# Patient Record
Sex: Female | Born: 1980 | Race: Black or African American | Hispanic: No | Marital: Married | Smoking: Never smoker
Health system: Southern US, Community
[De-identification: ages and names within clinical notes are randomized; demographics above are authoritative.]

## PROBLEM LIST (undated history)

## (undated) DIAGNOSIS — G4730 Sleep apnea, unspecified: Secondary | ICD-10-CM

## (undated) DIAGNOSIS — J45909 Unspecified asthma, uncomplicated: Secondary | ICD-10-CM

## (undated) DIAGNOSIS — I499 Cardiac arrhythmia, unspecified: Secondary | ICD-10-CM

## (undated) DIAGNOSIS — K567 Ileus, unspecified: Secondary | ICD-10-CM

## (undated) DIAGNOSIS — Z91199 Patient's noncompliance with other medical treatment and regimen due to unspecified reason: Secondary | ICD-10-CM

## (undated) DIAGNOSIS — Z800 Family history of malignant neoplasm of digestive organs: Secondary | ICD-10-CM

## (undated) DIAGNOSIS — R0789 Other chest pain: Secondary | ICD-10-CM

## (undated) DIAGNOSIS — N83209 Unspecified ovarian cyst, unspecified side: Secondary | ICD-10-CM

## (undated) DIAGNOSIS — I509 Heart failure, unspecified: Secondary | ICD-10-CM

## (undated) DIAGNOSIS — F419 Anxiety disorder, unspecified: Secondary | ICD-10-CM

## (undated) DIAGNOSIS — D219 Benign neoplasm of connective and other soft tissue, unspecified: Secondary | ICD-10-CM

## (undated) DIAGNOSIS — G4733 Obstructive sleep apnea (adult) (pediatric): Secondary | ICD-10-CM

## (undated) DIAGNOSIS — R011 Cardiac murmur, unspecified: Secondary | ICD-10-CM

## (undated) DIAGNOSIS — K219 Gastro-esophageal reflux disease without esophagitis: Secondary | ICD-10-CM

## (undated) DIAGNOSIS — I493 Ventricular premature depolarization: Secondary | ICD-10-CM

## (undated) DIAGNOSIS — Z8489 Family history of other specified conditions: Secondary | ICD-10-CM

## (undated) DIAGNOSIS — I10 Essential (primary) hypertension: Secondary | ICD-10-CM

## (undated) DIAGNOSIS — R87810 Cervical high risk human papillomavirus (HPV) DNA test positive: Secondary | ICD-10-CM

## (undated) DIAGNOSIS — A749 Chlamydial infection, unspecified: Secondary | ICD-10-CM

## (undated) HISTORY — DX: Chlamydial infection, unspecified: A74.9

## (undated) HISTORY — PX: INFERIOR OBLIQUE MYECTOMY: SHX1814

## (undated) HISTORY — DX: Family history of malignant neoplasm of digestive organs: Z80.0

## (undated) HISTORY — PX: OVARIAN CYST REMOVAL: SHX89

## (undated) HISTORY — PX: APPENDECTOMY: SHX54

## (undated) HISTORY — DX: Unspecified ovarian cyst, unspecified side: N83.209

## (undated) HISTORY — PX: DILATION AND CURETTAGE OF UTERUS: SHX78

## (undated) HISTORY — DX: Sleep apnea, unspecified: G47.30

## (undated) HISTORY — DX: Benign neoplasm of connective and other soft tissue, unspecified: D21.9

## (undated) HISTORY — PX: OTHER SURGICAL HISTORY: SHX169

## (undated) HISTORY — DX: Patient's noncompliance with other medical treatment and regimen due to unspecified reason: Z91.199

---

## 2019-09-21 ENCOUNTER — Ambulatory Visit
Admission: EM | Admit: 2019-09-21 | Discharge: 2019-09-21 | Disposition: A | Discharge status: Home / Self Care | Payer: BC Managed Care – PPO | Attending: Emergency Medicine | Admitting: Emergency Medicine

## 2019-09-21 ENCOUNTER — Encounter: Payer: Self-pay | Admitting: Emergency Medicine

## 2019-09-21 DIAGNOSIS — M791 Myalgia, unspecified site: Secondary | ICD-10-CM

## 2019-09-21 DIAGNOSIS — B349 Viral infection, unspecified: Secondary | ICD-10-CM

## 2019-09-21 DIAGNOSIS — R5383 Other fatigue: Secondary | ICD-10-CM | POA: Diagnosis not present

## 2019-09-21 NOTE — ED Provider Notes (Signed)
Roderic Palau    CSN: [id number] Arrival date & time: 09/21/19  1244      History   Chief Complaint Chief Complaint  Patient presents with  . Generalized Body Aches    HPI Alison Frazier is a 38 y.o. female.   Patient presents with body aches and fatigue x 2 days.  She also reports she had a headache briefly on the first day but this is resolved.  She denies fever, chills, ear pain, sore throat, cough, shortness of breath, vomiting, diarrhea, rash, or other symptoms.  No treatments attempted at home.    The history is provided by the patient.    History reviewed. No pertinent past medical history.  There are no problems to display for this patient.   Past Surgical History:  Procedure Laterality Date  . APPENDECTOMY    . dnc    . INFERIOR OBLIQUE MYECTOMY    . OVARIAN CYST REMOVAL      OB History   No obstetric history on file.      Home Medications    Prior to Admission medications   Not on File    Family History Family History  Problem Relation Age of Onset  . Stroke Mother   . Cancer Father   . Diabetes Father   . Hypertension Father     Social History Social History   Tobacco Use  . Smoking status: Never Smoker  . Smokeless tobacco: Never Used  Substance Use Topics  . Alcohol use: Not Currently  . Drug use: Not Currently     Allergies   Contrast media  [iodinated diagnostic agents] and Shellfish-derived products   Review of Systems Review of Systems  Constitutional: Positive for fatigue. Negative for chills and fever.  HENT: Negative for congestion, ear pain, rhinorrhea and sore throat.   Eyes: Negative for pain and visual disturbance.  Respiratory: Negative for cough and shortness of breath.   Cardiovascular: Negative for chest pain and palpitations.  Gastrointestinal: Negative for abdominal pain, diarrhea, nausea and vomiting.  Genitourinary: Negative for dysuria and hematuria.  Musculoskeletal: Negative for arthralgias and  back pain.  Skin: Negative for color change and rash.  Neurological: Negative for seizures and syncope.  All other systems reviewed and are negative.    Physical Exam Triage Vital Signs ED Triage Vitals  Enc Vitals Group     BP 09/21/19 1256 (!) 154/89     Pulse Rate 09/21/19 1256 82     Resp 09/21/19 1256 18     Temp 09/21/19 1256 97.7 F (36.5 C)     Temp src --      SpO2 09/21/19 1256 98 %     Weight --      Height --      Head Circumference --      Peak Flow --      Pain Score 09/21/19 1249 9     Pain Loc --      Pain Edu? --      Excl. in [place]? --    No data found.  Updated Vital Signs BP (!) 154/89   Pulse 82   Temp 97.7 F (36.5 C)   Resp 18   LMP 09/07/2019   SpO2 98%   Visual Acuity Right Eye Distance:   Left Eye Distance:   Bilateral Distance:    Right Eye Near:   Left Eye Near:    Bilateral Near:     Physical Exam Vitals and nursing note reviewed.  Constitutional:      General: She is not in acute distress.    Appearance: She is well-developed. She is not ill-appearing.  HENT:     Head: Normocephalic and atraumatic.     Right Ear: Tympanic membrane normal.     Left Ear: Tympanic membrane normal.     Nose: Nose normal.     Mouth/Throat:     Mouth: Mucous membranes are moist.     Pharynx: Oropharynx is clear.  Eyes:     Conjunctiva/sclera: Conjunctivae normal.  Cardiovascular:     Rate and Rhythm: Normal rate and regular rhythm.     Heart sounds: No murmur.  Pulmonary:     Effort: Pulmonary effort is normal. No respiratory distress.     Breath sounds: Normal breath sounds.  Abdominal:     General: Bowel sounds are normal.     Palpations: Abdomen is soft.     Tenderness: There is no abdominal tenderness. There is no guarding or rebound.  Musculoskeletal:     Cervical back: Neck supple.  Skin:    General: Skin is warm and dry.     Findings: No rash.  Neurological:     General: No focal deficit present.     Mental Status: She is alert  and oriented to person, place, and time.  Psychiatric:        Mood and Affect: Mood normal.        Behavior: Behavior normal.      UC Treatments / Results  Labs (all labs ordered are listed, but only abnormal results are displayed) Labs Reviewed  NOVEL CORONAVIRUS, NAA    EKG   Radiology No results found.  Procedures Procedures (including critical care time)  Medications Ordered in UC Medications - No data to display  Initial Impression / Assessment and Plan / UC Course  I have reviewed the triage vital signs and the nursing notes.  Pertinent labs & imaging results that were available during my care of the patient were reviewed by me and considered in my medical decision making (see chart for details).    Viral illness.  Instructed patient to take Tylenol as needed for fever or discomfort.  COVID test performed here.  Instructed patient to self quarantine until the test result is back.  Instructed patient to go to the emergency department if she develops high fever, shortness of breath, severe diarrhea, or other concerning symptoms.  Patient agrees with plan of care.     Final Clinical Impressions(s) / UC Diagnoses   Final diagnoses:  Viral illness     Discharge Instructions     Take Tylenol as needed for fever or discomfort.    Your COVID test is pending.  You should self quarantine until your test result is back and is negative.    Go to the emergency department if you develop high fever, shortness of breath, severe diarrhea, or other concerning symptoms.       ED Prescriptions    None     PDMP not reviewed this encounter.   Sharion Balloon, NP 09/21/19 [id number]

## 2019-09-21 NOTE — ED Triage Notes (Signed)
Pt c/o body aches and body soreness x2 days. Denies sick exposures. Denies any other symptoms. States she had a bad headache on Tuesday.

## 2019-09-21 NOTE — Discharge Instructions (Addendum)
Take Tylenol as needed for fever or discomfort.    Your COVID test is pending.  You should self quarantine until your test result is back and is negative.    Go to the emergency department if you develop high fever, shortness of breath, severe diarrhea, or other concerning symptoms.

## 2019-09-23 LAB — NOVEL CORONAVIRUS, NAA: SARS-CoV-2, NAA: NOT DETECTED

## 2019-11-07 NOTE — Patient Instructions (Addendum)
I value your feedback and entrusting us with your care. If you get a [facility] patient survey, I would appreciate you taking the time to let us know about your experience today. Thank you!  As of September 07, 2019, your lab results will be released to your MyChart immediately, before I even have a chance to see them. Please give me time to review them and contact you if there are any abnormalities. Thank you for your patience.   [facility]: [phone]  [facility]: [phone]  

## 2019-11-07 NOTE — Progress Notes (Signed)
PCP:  [facility], [facility]   Chief Complaint  Patient presents with  . Gynecologic Exam    lower abdomen/lower back discomfort/pressure x 2 weeks, no uti sx     HPI:      [name] is a 39 y.o. No obstetric history on file. who LMP was Patient's last menstrual period was 10/09/2019 (approximate)., presents today for her NP annual examination.  Her menses are regular every 28-30 days, lasting 7 days.  Dysmenorrhea none. She does not have intermenstrual bleeding. Did have 1 day of nausea and bleeding after her 12/20 menses, but that was unusual for pt.  Has been having pelvic pressure intermittently daily for the past 1-2 wks. No urin sx, vag, or GI sx. No fevers. Does have bad LBP mid lumbar area. No meds taken for sx. No aggrav/allev factors. Back hurts more when lying down however. Not affecting sleep. Hx of ovar cyst in past with cystectomy, as well as leio and myomectomy.   Sex activity: single partner, contraception - none.  Last Pap: not recent; no hx of abn with tx Hx of STDs: chlamydia in distant past  Last mammogram: never There is no FH of breast cancer. There is no FH of ovarian cancer. The patient does do self-breast exams.  Tobacco use: The patient denies current or previous tobacco use. Alcohol use: none No drug use.  Exercise: not active  She does get adequate calcium and Vitamin D in her diet. No recent labs. New to area, getting established with new PCP. Wants to wait for labs with them.   Past Medical History:  Diagnosis Date  . Chlamydia   . Family history of colon cancer in father   . Leiomyoma   . Ovarian cyst      Past Surgical History:  Procedure Laterality Date  . APPENDECTOMY    . DILATION AND CURETTAGE OF UTERUS    . dnc    . INFERIOR OBLIQUE MYECTOMY    . OVARIAN CYST REMOVAL      Family History  Problem Relation Age of Onset  . Stroke Mother   . Diabetes Father   . Hypertension Father   . Colon cancer Father    . Throat cancer Father   . Breast cancer Neg Hx   . Ovarian cancer Neg Hx     Social History   Socioeconomic History  . Marital status: Married    Spouse name: Not on file  . Number of children: Not on file  . Years of education: Not on file  . Highest education level: Not on file  Occupational History  . Not on file  Tobacco Use  . Smoking status: Never Smoker  . Smokeless tobacco: Never Used  Substance and Sexual Activity  . Alcohol use: Not Currently  . Drug use: Not Currently  . Sexual activity: Yes    Birth control/protection: None  Other Topics Concern  . Not on file  Social History Narrative  . Not on file   Social Determinants of Health   Financial Resource Strain:   . Difficulty of Paying Living Expenses: Not on file  Food Insecurity:   . Worried About Charity fundraiser in the Last Year: Not on file  . Ran Out of Food in the Last Year: Not on file  Transportation Needs:   . Lack of Transportation (Medical): Not on file  . Lack of Transportation (Non-Medical): Not on file  Physical Activity:   . Days of Exercise  per Week: Not on file  . Minutes of Exercise per Session: Not on file  Stress:   . Feeling of Stress : Not on file  Social Connections:   . Frequency of Communication with Friends and Family: Not on file  . Frequency of Social Gatherings with Friends and Family: Not on file  . Attends Religious Services: Not on file  . Active Member of Clubs or Organizations: Not on file  . Attends Archivist Meetings: Not on file  . Marital Status: Not on file  Intimate Partner Violence:   . Fear of Current or Ex-Partner: Not on file  . Emotionally Abused: Not on file  . Physically Abused: Not on file  . Sexually Abused: Not on file    No current outpatient medications on file.     ROS:  Review of Systems  Constitutional: Negative for fatigue, fever and unexpected weight change.  Respiratory: Negative for cough, shortness of breath and  wheezing.   Cardiovascular: Negative for chest pain, palpitations and leg swelling.  Gastrointestinal: Negative for blood in stool, constipation, diarrhea, nausea and vomiting.  Endocrine: Negative for cold intolerance, heat intolerance and polyuria.  Genitourinary: Positive for pelvic pain. Negative for dyspareunia, dysuria, flank pain, frequency, genital sores, hematuria, menstrual problem, urgency, vaginal bleeding, vaginal discharge and vaginal pain.  Musculoskeletal: Positive for back pain. Negative for joint swelling and myalgias.  Skin: Negative for rash.  Neurological: Negative for dizziness, syncope, light-headedness, numbness and headaches.  Hematological: Negative for adenopathy.  Psychiatric/Behavioral: Positive for agitation. Negative for confusion, sleep disturbance and suicidal ideas. The patient is not nervous/anxious.   BREAST: No symptoms   Objective: BP 140/90   Ht 5\' 4"  (1.626 m)   Wt 185 lb (83.9 kg)   LMP 10/09/2019 (Approximate)   BMI 31.76 kg/m    Physical Exam Constitutional:      Appearance: She is well-developed.  Genitourinary:     Vulva, vagina, cervix, uterus, right adnexa and left adnexa normal.     No vulval lesion or tenderness noted.     No vaginal discharge, erythema or tenderness.     No cervical polyp.     Uterus is not enlarged or tender.     No right or left adnexal mass present.     Right adnexa not tender.     Left adnexa not tender.  Neck:     Thyroid: No thyromegaly.  Cardiovascular:     Rate and Rhythm: Normal rate and regular rhythm.     Heart sounds: Normal heart sounds. No murmur.  Pulmonary:     Effort: Pulmonary effort is normal.     Breath sounds: Normal breath sounds.  Chest:     Breasts:        Right: No mass, nipple discharge, skin change or tenderness.        Left: No mass, nipple discharge, skin change or tenderness.  Abdominal:     Palpations: Abdomen is soft.     Tenderness: There is no abdominal tenderness.  There is no guarding.  Musculoskeletal:        General: Normal range of motion.     Cervical back: Normal range of motion.     Lumbar back: No signs of trauma, tenderness or bony tenderness.       Back:  Neurological:     General: No focal deficit present.     Mental Status: She is alert and oriented to person, place, and time.     Cranial  Nerves: No cranial nerve deficit.  Skin:    General: Skin is warm and dry.  Psychiatric:        Mood and Affect: Mood normal.        Behavior: Behavior normal.        Thought Content: Thought content normal.        Judgment: Judgment normal.  Vitals reviewed.     Results:  ULTRASOUND REPORT  Location: [facility]  Date of Service: 11/08/2019    Indications:Pelvic Pain Findings:  The uterus is retroverted and measures 6.9 x 4.2 x 4.0 cm. Echo texture is homogenous with evidence of focal masses. Within the uterus is one suspected fibroid measuring: Fibroid 1:20.4 x 16.6 x 13.9 mm less than 50% submucosal, posterior fundal  The Endometrium measures 7.6 mm.  Right Ovary measures 4.6 x 4.5 x 3.3 cm. There is a small, complex cyst in the right ovary. The cyst measures 25.3 x 25.0 x 25.9 mm. There is blood from around the periphery of the cyst only. This may represent a corpus luteal cyst vs. Other.  Left Ovary measures 3.1 x 3.3 x 2.0 cm. It is normal in appearance. Survey of the adnexa shows a right hydrosalpinx with anechoic fluid. The right fallopian tube measures up to 21.5 mm. Normal appearing left adnexa. There is a small amount of anechoic free fluid in the cul de sac.  Impression: 1. There is one 2.0 cm fibroid that may have a small portion that is submucosal.  2. Normal appearing endometrium, right ovary and cervix.  3. There is a 2.6 cm complex cyst in the right ovary.  4. There is a right hydrosalpinx.    Recommendations: 1.Clinical correlation with the patient's History and Physical Exam.   Gweneth Dimitri, RT  Assessment/Plan: Encounter for annual routine gynecological examination  Cervical cancer screening - Plan: Cytology -   Screening for STD (sexually transmitted disease) - Plan: Cytology - PAP  Screening for HPV (human papillomavirus) - Plan: Cytology - PAP,   Encounter for screening mammogram for malignant neoplasm of breast - Plan: MM 3D SCREEN BREAST BILATERAL; pt to sched mammo  Pelvic pressure in female - Plan: US PELVIS TRANSVAGINAL NON-OB (TV ONLY); small leio, RT complex cyst and RT hydrosalpinx on u/s. Rule out STD. If neg, will check ca-125 although most likely normal. If all neg, just repeat GYN u/s in 8 wks. Pt agrees.  Leiomyoma - Plan: US PELVIS TRANSVAGINAL NON-OB (TV ONLY); very small; most likely no change in size compared to pt report of previous leio size  Hydrosalpinx--rule out STD with labs. If neg, check ca-125. If neg, recheck u/s in 8 months.   Low back pain--stretch/core exercises/NSAIDs and heating pad. Most likely MSK.      GYN counsel breast self exam, mammography screening, adequate intake of calcium and vitamin D, diet and exercise     F/U  Return in about 1 year (around 11/07/2020).   B. , PA-C 11/08/2019 4:10 PM

## 2019-11-08 ENCOUNTER — Other Ambulatory Visit: Payer: Self-pay

## 2019-11-08 ENCOUNTER — Encounter: Payer: Self-pay | Admitting: Obstetrics and Gynecology

## 2019-11-08 ENCOUNTER — Other Ambulatory Visit (HOSPITAL_COMMUNITY)
Admission: RE | Admit: 2019-11-08 | Discharge: 2019-11-08 | Disposition: A | Discharge status: Home / Self Care | Payer: BC Managed Care – PPO | Source: Ambulatory Visit | Attending: Obstetrics and Gynecology | Admitting: Obstetrics and Gynecology

## 2019-11-08 ENCOUNTER — Ambulatory Visit (INDEPENDENT_AMBULATORY_CARE_PROVIDER_SITE_OTHER): Payer: BC Managed Care – PPO | Admitting: Obstetrics and Gynecology

## 2019-11-08 ENCOUNTER — Ambulatory Visit (INDEPENDENT_AMBULATORY_CARE_PROVIDER_SITE_OTHER): Payer: BC Managed Care – PPO

## 2019-11-08 VITALS — BP 140/90 | Ht 64.0 in | Wt 185.0 lb

## 2019-11-08 DIAGNOSIS — N83201 Unspecified ovarian cyst, right side: Secondary | ICD-10-CM | POA: Diagnosis not present

## 2019-11-08 DIAGNOSIS — Z1231 Encounter for screening mammogram for malignant neoplasm of breast: Secondary | ICD-10-CM

## 2019-11-08 DIAGNOSIS — Z01411 Encounter for gynecological examination (general) (routine) with abnormal findings: Secondary | ICD-10-CM | POA: Diagnosis not present

## 2019-11-08 DIAGNOSIS — D219 Benign neoplasm of connective and other soft tissue, unspecified: Secondary | ICD-10-CM | POA: Diagnosis not present

## 2019-11-08 DIAGNOSIS — R102 Pelvic and perineal pain: Secondary | ICD-10-CM | POA: Diagnosis not present

## 2019-11-08 DIAGNOSIS — Z1151 Encounter for screening for human papillomavirus (HPV): Secondary | ICD-10-CM | POA: Diagnosis present

## 2019-11-08 DIAGNOSIS — N7011 Chronic salpingitis: Secondary | ICD-10-CM

## 2019-11-08 DIAGNOSIS — Z124 Encounter for screening for malignant neoplasm of cervix: Secondary | ICD-10-CM

## 2019-11-08 DIAGNOSIS — D250 Submucous leiomyoma of uterus: Secondary | ICD-10-CM

## 2019-11-08 DIAGNOSIS — Z113 Encounter for screening for infections with a predominantly sexual mode of transmission: Secondary | ICD-10-CM | POA: Diagnosis present

## 2019-11-08 DIAGNOSIS — Z01419 Encounter for gynecological examination (general) (routine) without abnormal findings: Secondary | ICD-10-CM

## 2019-11-10 LAB — CYTOLOGY - PAP
Chlamydia: NEGATIVE
Comment: NEGATIVE
Comment: NEGATIVE
Comment: NORMAL
Diagnosis: NEGATIVE
High risk HPV: POSITIVE — AB
Neisseria Gonorrhea: NEGATIVE

## 2019-11-13 ENCOUNTER — Telehealth: Payer: Self-pay | Admitting: Obstetrics and Gynecology

## 2019-11-13 DIAGNOSIS — N83201 Unspecified ovarian cyst, right side: Secondary | ICD-10-CM

## 2019-11-13 DIAGNOSIS — N7011 Chronic salpingitis: Secondary | ICD-10-CM

## 2019-11-13 NOTE — Telephone Encounter (Signed)
Alison Frazier. Pt with neg pap/pos HPV DNA. Repeat pap in 1 yr. Neg STD testing. Check ca-125 due to hydrosalpinx. If neg, repeat u/s in 8 wks.

## 2019-11-14 ENCOUNTER — Other Ambulatory Visit: Payer: BC Managed Care – PPO

## 2019-11-14 ENCOUNTER — Other Ambulatory Visit: Payer: Self-pay

## 2019-11-14 DIAGNOSIS — N83201 Unspecified ovarian cyst, right side: Secondary | ICD-10-CM

## 2019-11-14 DIAGNOSIS — N7011 Chronic salpingitis: Secondary | ICD-10-CM

## 2019-11-15 LAB — CA 125: Cancer Antigen (CA) 125: 8.5 U/mL (ref 0.0–38.1)

## 2019-11-25 ENCOUNTER — Ambulatory Visit: Payer: BC Managed Care – PPO | Attending: Internal Medicine

## 2019-11-25 ENCOUNTER — Ambulatory Visit: Payer: BC Managed Care – PPO

## 2019-11-25 DIAGNOSIS — Z23 Encounter for immunization: Secondary | ICD-10-CM | POA: Insufficient documentation

## 2019-11-25 NOTE — Progress Notes (Signed)
   Covid-19 Vaccination Clinic  Name:  Alison Frazier    MRN: [id number] DOB: 03/31/1981  11/25/2019  Alison Frazier was observed post Covid-19 immunization for 15 minutes without incidence. She was provided with Vaccine Information Sheet and instruction to access the V-Safe system.   Alison Frazier was instructed to call 911 with any severe reactions post vaccine: Marland Kitchen Difficulty breathing  . Swelling of your face and throat  . A fast heartbeat  . A bad rash all over your body  . Dizziness and weakness    Immunizations Administered    Name Date Dose VIS Date Route   Moderna COVID-19 Vaccine 11/25/2019  3:12 PM 0.5 mL 08/29/2019 Intramuscular   Manufacturer: Moderna   Lot: [id number]   [place][id number]

## 2019-12-23 ENCOUNTER — Ambulatory Visit: Payer: BC Managed Care – PPO | Attending: Internal Medicine

## 2019-12-23 DIAGNOSIS — Z23 Encounter for immunization: Secondary | ICD-10-CM

## 2019-12-23 NOTE — Progress Notes (Signed)
   Covid-19 Vaccination Clinic  Name:  Alison Frazier    MRN: [id number] DOB: 02/10/1981  12/23/2019  [name] was observed post Covid-19 immunization for 15 minutes without incident. She was provided with Vaccine Information Sheet and instruction to access the V-Safe system.   [name] was instructed to call 911 with any severe reactions post vaccine: Marland Kitchen Difficulty breathing  . Swelling of face and throat  . A fast heartbeat  . A bad rash all over body  . Dizziness and weakness   Immunizations Administered    Name Date Dose VIS Date Route   Moderna COVID-19 Vaccine 12/23/2019 12:51 PM 0.5 mL 08/29/2019 Intramuscular   Manufacturer: Levan Hurst   Lot: [id number]   [place]: [id number]

## 2020-01-08 ENCOUNTER — Telehealth: Payer: Self-pay | Admitting: Obstetrics and Gynecology

## 2020-01-08 DIAGNOSIS — N7011 Chronic salpingitis: Secondary | ICD-10-CM

## 2020-01-08 DIAGNOSIS — N83201 Unspecified ovarian cyst, right side: Secondary | ICD-10-CM

## 2020-01-08 DIAGNOSIS — Z01419 Encounter for gynecological examination (general) (routine) without abnormal findings: Secondary | ICD-10-CM

## 2020-01-08 NOTE — Telephone Encounter (Signed)
-----   Message from Cherlyn Cushing sent at 01/05/2020  1:17 PM EDT ----- Marykay Lex would you mind putting in a order for patient to have her U/S done on 01/10/2020. Let me know once done so can be linked.   Thanks  [employer]

## 2020-01-10 ENCOUNTER — Ambulatory Visit (INDEPENDENT_AMBULATORY_CARE_PROVIDER_SITE_OTHER): Payer: BC Managed Care – PPO

## 2020-01-10 ENCOUNTER — Other Ambulatory Visit: Payer: Self-pay

## 2020-01-10 DIAGNOSIS — N7011 Chronic salpingitis: Secondary | ICD-10-CM

## 2020-01-10 DIAGNOSIS — N83201 Unspecified ovarian cyst, right side: Secondary | ICD-10-CM

## 2020-01-15 ENCOUNTER — Telehealth: Payer: Self-pay | Admitting: Obstetrics and Gynecology

## 2020-01-15 NOTE — Telephone Encounter (Signed)
Patient returning ABC call.

## 2020-01-15 NOTE — Telephone Encounter (Signed)
Pls call pt to sched surg consult appt with [facility] for RLQ pain. May have to leave pt a msg. Thx.

## 2020-01-15 NOTE — Telephone Encounter (Signed)
Patient is calling for labs results. Please advise. 

## 2020-01-15 NOTE — Telephone Encounter (Signed)
Pt aware of resolved bilat complex ovarian cysts. Having worsening RLQ pain now. Initially was pressure at 2/21 appt and bilat ovar cysts identified; now sharp, stabbing pains lasting a few hrs, a few times daily, over several days. Has thought about going to ED due to intensity. Not relieved with NSAIDs/heating pad. No GI, urin, vag sx. No currently sex active, but denies dyspareunia/bleeding when she was. Had neg STD testing/ca-125 due to hydrosalpinx.  Given worsening pain, will refer to Dr. Kenton Kingfisher to discuss dx lab.   ULTRASOUND REPORT  Location: [facility]  Date of Service: 01/10/2020    Indications:Pelvic Pain Findings:  The uterus is retroverted and measures 3.3 x 4.1 x 4.1 cm. Echo texture is homogenous with evidence of focal masses. Within the uterus there is one fibroid measuring: Fibroid 1:16.4 x 14.4 x 17.1 mm intramural fundal  The Endometrium measures 8.4 mm.  Right Ovary measures 4.3 x 3.1 x 2.5 cm. It is normal in appearance. Left Ovary measures 2.9 x 4.1 x 2.2 cm. It is normal in appearance. There is a right hydrosalpinx measuring up to 21.0 mm.  Survey of the adnexa demonstrates no adnexal masses. There is no free fluid in the cul de sac.  Impression: 1. There is one intramural fibroid seen.  2. Normal appearing ovaries.  3. There is a right hydrosalpinx.   Recommendations: 1.Clinical correlation with the patient's History and Physical Exam.   Gweneth Dimitri, RT

## 2020-01-16 NOTE — Telephone Encounter (Signed)
Patient is schedule for 01/29/20 with [facility]

## 2020-01-16 NOTE — Telephone Encounter (Signed)
Pls. call pt to schedule appt..

## 2020-01-16 NOTE — Telephone Encounter (Signed)
Called and left voicemail for patient to call back to be scheduled. 

## 2020-01-18 ENCOUNTER — Telehealth: Payer: Self-pay

## 2020-01-18 ENCOUNTER — Other Ambulatory Visit: Payer: Self-pay | Admitting: Obstetrics and Gynecology

## 2020-01-18 NOTE — Telephone Encounter (Signed)
Spoke with pt. Has been taking tylenol and excedrin for pain with hydrosalpinx, as well as using a heating pad. No sx change. Hasn't tried ibup because that doesn't typically work for her. Hasn't taken in years. Suggested she take 800 mg TID for pain. If that doesn't work, [employer] send in Multimedia programmer. Has appt with Dr. Kenton Kingfisher 01/29/20.

## 2020-01-18 NOTE — Telephone Encounter (Signed)
Pt calling requesting something for the pain from her "fluid around fallopian tube". Please advise.

## 2020-01-29 ENCOUNTER — Other Ambulatory Visit: Payer: Self-pay

## 2020-01-29 ENCOUNTER — Encounter: Payer: Self-pay | Admitting: Obstetrics & Gynecology

## 2020-01-29 ENCOUNTER — Ambulatory Visit (INDEPENDENT_AMBULATORY_CARE_PROVIDER_SITE_OTHER): Payer: BC Managed Care – PPO | Admitting: Obstetrics & Gynecology

## 2020-01-29 VITALS — BP 120/80 | Ht 64.0 in | Wt 182.0 lb

## 2020-01-29 DIAGNOSIS — R102 Pelvic and perineal pain: Secondary | ICD-10-CM | POA: Insufficient documentation

## 2020-01-29 DIAGNOSIS — N7011 Chronic salpingitis: Secondary | ICD-10-CM | POA: Diagnosis not present

## 2020-01-29 NOTE — Progress Notes (Signed)
Gynecology Pelvic Pain Evaluation   Chief Complaint:  Chief Complaint  Patient presents with  . Consult    History of Present Illness:   Patient is a 39 y.o. G0P0000 who LMP was Patient's last menstrual period was 01/28/2020., presents today for a problem visit.  She complains of pain.   Her pain is localized to the RLQ area, described as sharp and stabbing, intermittent and but now more daily and constant, began several months ago and its severity is described as severe. The pain radiates to the  right back. She has these associated symptoms which include none. Patient has these modifiers which include nothing that make it better and nothing that make it worse.  Context includes: spontaneous.  Worse w ovulation timing at first, now all the time  Previous evaluation: ultrasound showing right sided hydrosalpinx in Feb and Apr. Prior Laparoscopies for myomectomy, appendectomy, and ovarian cystectomy  PMHx: She  has a past medical history of Chlamydia, Family history of colon cancer in father, Leiomyoma, and Ovarian cyst. Also,  has a past surgical history that includes Ovarian cyst removal; Appendectomy; Inferior oblique myectomy; dnc; and Dilation and curettage of uterus., family history includes Colon cancer in her father; Diabetes in her father; Hypertension in her father; Stroke in her mother; Throat cancer in her father.,  reports that she has never smoked. She has never used smokeless tobacco. She reports previous alcohol use. She reports previous drug use.  She currently has no medications in their medication list. Also, is allergic to contrast media  [iodinated diagnostic agents]; iodine; other; and shellfish-derived products.  Review of Systems  Constitutional: Positive for malaise/fatigue. Negative for chills and fever.  HENT: Negative for congestion, sinus pain and sore throat.   Eyes: Negative for blurred vision and pain.  Respiratory: Negative for cough and wheezing.     Cardiovascular: Negative for chest pain and leg swelling.  Gastrointestinal: Positive for abdominal pain and diarrhea. Negative for constipation, heartburn, nausea and vomiting.  Genitourinary: Negative for dysuria, frequency, hematuria and urgency.  Musculoskeletal: Negative for back pain, joint pain, myalgias and neck pain.  Skin: Negative for itching and rash.  Neurological: Negative for dizziness, tremors and weakness.  Endo/Heme/Allergies: Does not bruise/bleed easily.  Psychiatric/Behavioral: Negative for depression. The patient is not nervous/anxious and does not have insomnia.     Objective: BP 120/80   Ht 5\' 4"  (1.626 m)   Wt 182 lb (82.6 kg)   LMP 01/28/2020   BMI 31.24 kg/m  Physical Exam Constitutional:      General: She is not in acute distress.    Appearance: She is well-developed.  Abdominal:     General: Bowel sounds are normal.     Palpations: Abdomen is soft.     Tenderness: There is generalized abdominal tenderness. There is no rebound. Negative signs include Murphy's sign, Rovsing's sign and McBurney's sign.     Comments: Mild lower quad T  Musculoskeletal:        General: Normal range of motion.  Neurological:     Mental Status: She is alert and oriented to person, place, and time.  Skin:    General: Skin is warm and dry.  Vitals reviewed.    Assessment: 39 y.o. G0P0000 with Pelvic pain esp RLQ pain. Pt has a finding of right hydrosalpinx.  Problem List Items Addressed This Visit      Genitourinary   Hydrosalpinx     Other   Pelvic pain - Primary    Desires  future fertility.  In fact has been seen by [facility] w suggestion for IVF, they did not see this finding a year ago w SIS done then.  Plan- laparoscopy w possible salpingectomy based on nature of tube on right side.  Benefit of its removal for pain relief and also fertility chances and improved IVF results discussed.  Having healthy one tube is often better than two tubes w one damaged, based on  evidence form ectopic studies among others. Recovery expectations discussed. 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    Barnett Applebaum, MD, [facility], [facility] 01/29/2020  4:39 PM

## 2020-01-29 NOTE — Patient Instructions (Signed)
Diagnostic Laparoscopy Diagnostic laparoscopy is a procedure to diagnose diseases in the abdomen. It might be done for a variety of reasons, such as to look for scar tissue, cancer, or a reason for abdomen (abdominal) pain. During the procedure, a thin, flexible tube that has a light and a camera on the end (laparoscope) is inserted through an incision in the abdomen. The image from the camera is shown on a monitor to help your surgeon see inside your body. Tell a health care provider about:  Any allergies you have.  All medicines you are taking, including vitamins, herbs, eye drops, creams, and over-the-counter medicines.  Any problems you or family members have had with anesthetic medicines.  Any blood disorders you have.  Any surgeries you have had.  Any medical conditions you have. What are the risks? Generally, this is a safe procedure. However, problems may occur, including:  Infection.  Bleeding.  Allergic reactions to medicines or dyes.  Damage to abdominal structures or organs, such as the intestines, liver, stomach, or spleen. What happens before the procedure? Medicines  Ask your health care provider about: ? Changing or stopping your regular medicines. This is especially important if you are taking diabetes medicines or blood thinners. ? Taking medicines such as aspirin and ibuprofen. These medicines can thin your blood. Do not take these medicines unless your health care provider tells you to take them. ? Taking over-the-counter medicines, vitamins, herbs, and supplements.  You may be given antibiotic medicine to help prevent infection. Staying hydrated Follow instructions from your health care provider about hydration, which may include:  Up to 2 hours before the procedure - you may continue to drink clear liquids, such as water, clear fruit juice, black coffee, and plain tea. Eating and drinking restrictions Follow instructions from your health care provider  about eating and drinking, which may include:  8 hours before the procedure - stop eating heavy meals or foods such as meat, fried foods, or fatty foods.  6 hours before the procedure - stop eating light meals or foods, such as toast or cereal.  6 hours before the procedure - stop drinking milk or drinks that contain milk.  2 hours before the procedure - stop drinking clear liquids. General instructions  Ask your health care provider how your surgical site will be marked or identified.  You may be asked to shower with a germ-killing soap.  Plan to have someone take you home from the hospital or clinic.  Plan to have a responsible adult care for you for at least 24 hours after you leave the hospital or clinic. This is important. What happens during the procedure?   To lower your risk of infection: ? Your health care team will wash or sanitize their hands. ? Hair may be removed from the surgical area. ? Your skin will be washed with soap.  An IV will be inserted into one of your veins.  You will be given a medicine to make you fall asleep (general anesthetic). You may also be given a medicine to help you relax (sedative).  A breathing tube will be placed down your throat to help you breathe during the procedure.  Your abdomen will be filled with an air-like gas so it expands. This will give the surgeon more room to operate and will make your organs easier to see.  Many small incisions will be made in your abdomen.  A laparoscope and other surgical instruments will be inserted into your abdomen through the   incisions.  A tissue sample may be removed from an organ for examination (biopsy). This will depend on the reason why you are having this procedure.  The laparoscope and other instruments will be removed from your abdomen.  The gas will be released.  Your incisions will be closed with stitches (sutures) and covered with a bandage (dressing).  Your breathing tube will be  removed. The procedure may vary among health care providers and hospitals. What happens after the procedure?   Your blood pressure, heart rate, breathing rate, and blood oxygen level will be monitored until the medicines you were given have worn off.  Do not drive for 24 hours if you were given a sedative during your procedure.  It is up to you to get the results of your procedure. Ask your health care provider, or the department that is doing the procedure, when your results will be ready. Summary  Diagnostic laparoscopy is a way to look for problems in the abdomen using small incisions.  Follow instructions from your health care provider about how to prepare for the procedure.  Plan to have a responsible adult care for you for at least 24 hours after you leave the hospital or clinic. This is important. This information is not intended to replace advice given to you by your health care provider. Make sure you discuss any questions you have with your health care provider. Document Revised: 08/27/2017 Document Reviewed: 03/10/2017 Elsevier Patient Education  [place].   Possible need for Salpingectomy Salpingectomy, also called tubectomy, is the surgical removal of one of the fallopian tubes. The fallopian tubes are where eggs travel from the ovaries to the uterus. Removing one fallopian tube does not prevent you from becoming pregnant. It also does not cause problems with your menstrual periods. You may need this procedure if you:  Have a fertilized egg that attaches to the fallopian tube (ectopic pregnancy), especially one that causes the tube to burst or tear (rupture).  Have an infected fallopian tube.  Have cancer of the fallopian tube or nearby organs.  Have had an ovary removed due to a cyst or tumor.  Have had your uterus removed.  Are at high risk for ovarian cancer. There are three different methods that can be used for a salpingectomy:  An open method that  involves making one large incision in your abdomen.  A laparoscopic method that involves using a thin, lighted tube with a tiny camera on the end (laparoscope) to help perform the procedure. The laparoscope will allow your surgeon to make several small incisions in the abdomen instead of one large incision.  A robot-assisted method that involves using a computer to control surgical instruments that are attached to robotic arms. Tell a health care provider about:  Any allergies you have.  All medicines you are taking, including vitamins, herbs, eye drops, creams, and over-the-counter medicines.  Any problems you or family members have had with anesthetic medicines.  Any blood disorders you have.  Any surgeries you have had.  Any medical conditions you have.  Whether you are pregnant or may be pregnant. What are the risks? Generally, this is a safe procedure. However, problems may occur, including:  Infection.  Bleeding.  Allergic reactions to medicines.  Blood clots in the legs or lungs.  Damage to other structures or organs. What happens before the procedure? Medicines  Ask your health care provider about: ? Changing or stopping your regular medicines. This is especially important if you are  taking diabetes medicines or blood thinners. ? Taking medicines such as aspirin and ibuprofen. These medicines can thin your blood. Do not take these medicines unless your health care provider tells you to take them. ? Taking over-the-counter medicines, vitamins, herbs, and supplements. Staying hydrated Follow instructions from your health care provider about hydration, which may include:  Up to 2 hours before the procedure - you may continue to drink clear liquids, such as water, clear fruit juice, black coffee, and plain tea. Eating and drinking restrictions Follow instructions from your health care provider about eating and drinking, which may include:  8 hours before the  procedure - stop eating heavy meals or foods, such as meat, fried foods, or fatty foods.  6 hours before the procedure - stop eating light meals or foods, such as toast or cereal.  6 hours before the procedure - stop drinking milk or drinks that contain milk.  2 hours before the procedure - stop drinking clear liquids. General instructions  Do not use any products that contain nicotine or tobacco for at least 4 weeks before the procedure. These products include cigarettes, e-cigarettes, and chewing tobacco. If you need help quitting, ask your health care provider.  You may have an exam or tests, such as: ? An electrocardiogram (ECG). ? A blood or urine test.  Ask your health care provider what steps will be taken to help prevent infection. These may include: ? Removing hair at the surgery site. ? Washing skin with a germ-killing soap. ? Taking antibiotic medicine.  Plan to have someone take you home from the hospital or clinic.  If you will be going home right after the procedure, plan to have someone with you for 24 hours. What happens during the procedure?  An IV will be inserted into one of your veins.  You will be given one or more of the following: ? A medicine to help you relax (sedative). ? A medicine to make you fall asleep (general anesthetic).  A thin tube (catheter) may be inserted through your urethra and into your bladder to drain urine during your procedure.  Depending on the type of procedure you are having, one incision or several small incisions will be made in your abdomen.  Your fallopian tube will be cut and removed from where it attaches to your uterus.  Your blood vessels will be clamped and tied to prevent excess bleeding.  The incisions in your abdomen will be closed with stitches (sutures), staples, or skin glue.  A bandage (dressing) may be placed over your incisions. The procedure may vary among health care providers and hospitals. What happens  after the procedure?   Your blood pressure, heart rate, breathing rate, and blood oxygen level will be monitored until you leave the hospital.  You may continue to receive fluids and medicines through an IV.  You may continue to have a catheter draining your urine.  You may have to wear compression stockings. These stockings help to prevent blood clots and reduce swelling in your legs.  You will be given pain medicine as needed.  Do not drive for 24 hours if you were given a sedative during your procedure. Summary  Salpingectomy is a surgical procedure to remove one of the fallopian tubes.  The procedure may be done with an open incision, a laparoscope, or computer-controlled instruments.  Depending on the type of procedure you are having, one incision or several small incisions will be made in your abdomen.  Your blood pressure,  heart rate, breathing rate, and blood oxygen level will be monitored until you leave the hospital.  Plan to have someone take you home from the hospital or clinic. This information is not intended to replace advice given to you by your health care provider. Make sure you discuss any questions you have with your health care provider. Document Revised: 09/05/2018 Document Reviewed: 09/05/2018 Elsevier Patient Education  [place].

## 2020-01-30 ENCOUNTER — Telehealth: Payer: Self-pay | Admitting: Obstetrics & Gynecology

## 2020-01-30 NOTE — Telephone Encounter (Signed)
Pt rtnd call to discuss surgery with [facility]  DOS 03/14/20  H&P 6/4 @ 3:30  Covid 6/15 @ 8-10:30, [address] drive up and wear a mask. Adv to quar until [employer]  Adv pt may rec calls from hospital pharmacy and pre-svc ctr. I will also request a pre-admit phone appt that will be listed on MyChart as well as H&P paperwork.  Conf pt has [employer] ins

## 2020-01-30 NOTE — Telephone Encounter (Signed)
-----   Message from Gae Dry, MD sent at 01/29/2020  4:37 PM EDT ----- Regarding: Surgery Surgery Booking Request Patient Full Name:  Alison Frazier  MRN: [id number]  DOB: 25-May-1981  Surgeon: Hoyt Koch, MD  Requested Surgery Date and Time: June 17,2021 Primary Diagnosis AND Code:   ICD-10-CM  1. Pelvic pain  R10.2  2. Hydrosalpinx  N70.11  Secondary Diagnosis and Code:  Surgical Procedure: Operative Laparoscopy with Possible Right Salpingectomy and Chromopertubation L&D Notification: No Admission Status: same day surgery Length of Surgery: 1 hr Special Case Needs: Yes, Methylene Blue Dye H&P: Yes Phone Interview???:  Yes Interpreter: No Language:  Medical Clearance:  No Special Scheduling Instructions: no Any known health/anesthesia issues, diabetes, sleep apnea, latex allergy, defibrillator/pacemaker?: No Acuity: P3   (P1 highest, P2 delay may cause harm, P3 low, elective gyn, P4 lowest)

## 2020-01-30 NOTE — Telephone Encounter (Signed)
LM for pt to rtn call. 

## 2020-02-12 ENCOUNTER — Telehealth: Payer: Self-pay | Admitting: Obstetrics & Gynecology

## 2020-02-12 NOTE — Telephone Encounter (Signed)
Pt called wanting to see if we could possibly schedule her surgery the week of 6/8. While talking to her and looking at availability she remembered that she had a conflict. She decided to keep where it was.  I asked if she would like to look at any other dates. 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 She said that she is currently in a great deal of pain and that it "took an hour to get up out of my chair". I adv that I would pass this info to Dr Kenton Kingfisher. You could hear the discomfort in her voice.  [phone] pt cell

## 2020-02-12 NOTE — Telephone Encounter (Signed)
Pt wants to keep the original surgery date and try the pain med's now. 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 She's aware you will send in a rx today.

## 2020-02-12 NOTE — Telephone Encounter (Signed)
Offer surgery earlier, even this week or next. Offer pain medicine, but may make drowsy

## 2020-02-13 ENCOUNTER — Other Ambulatory Visit: Payer: Self-pay | Admitting: Obstetrics & Gynecology

## 2020-02-13 MED ORDER — MELOXICAM 7.5 MG PO TABS: 7.5000 mg | ORAL_TABLET | Freq: Two times a day (BID) | ORAL | 1 refills | Status: DC | PRN

## 2020-02-13 MED ORDER — OXYCODONE-ACETAMINOPHEN 5-325 MG PO TABS: 1.0000 | ORAL_TABLET | ORAL | 0 refills | Status: DC | PRN

## 2020-02-13 NOTE — Telephone Encounter (Signed)
Called pt ..her phone is not ringing, will try again later

## 2020-02-13 NOTE — Telephone Encounter (Signed)
Let her know 2 Rx sent.  Meloxicam once or twice daily, non-narcotic for pain and no drowsy side effects.  Also, Percocet for breakthru pain, this is a narcotic.

## 2020-02-13 NOTE — Telephone Encounter (Signed)
Pt aware.

## 2020-02-14 ENCOUNTER — Other Ambulatory Visit: Payer: Self-pay | Admitting: Obstetrics & Gynecology

## 2020-02-14 ENCOUNTER — Telehealth: Payer: Self-pay

## 2020-02-14 MED ORDER — DICLOFENAC SODIUM 75 MG PO TBEC: 75.0000 mg | DELAYED_RELEASE_TABLET | Freq: Two times a day (BID) | ORAL | 3 refills | Status: DC | PRN

## 2020-02-14 NOTE — Telephone Encounter (Signed)
Alternative medicine called in, see if it is better covered (we have no way of knowing when we prescribe, the prescription writer says both are covered to a degree)

## 2020-02-14 NOTE — Telephone Encounter (Signed)
Pt insurance does not cover the meloxicam, is there anything else you can send in

## 2020-02-14 NOTE — Telephone Encounter (Signed)
Pt aware.

## 2020-02-19 ENCOUNTER — Telehealth: Payer: Self-pay | Admitting: Obstetrics & Gynecology

## 2020-02-19 NOTE — Telephone Encounter (Signed)
error 

## 2020-03-01 ENCOUNTER — Encounter: Payer: BC Managed Care – PPO | Admitting: Obstetrics & Gynecology

## 2020-03-04 ENCOUNTER — Encounter
Admission: RE | Admit: 2020-03-04 | Discharge: 2020-03-04 | Disposition: A | Discharge status: Home / Self Care | Payer: BC Managed Care – PPO | Source: Ambulatory Visit | Attending: Obstetrics & Gynecology | Admitting: Obstetrics & Gynecology

## 2020-03-04 ENCOUNTER — Other Ambulatory Visit: Payer: Self-pay

## 2020-03-04 ENCOUNTER — Ambulatory Visit (INDEPENDENT_AMBULATORY_CARE_PROVIDER_SITE_OTHER): Payer: BC Managed Care – PPO | Admitting: Obstetrics & Gynecology

## 2020-03-04 ENCOUNTER — Encounter: Payer: Self-pay | Admitting: Obstetrics & Gynecology

## 2020-03-04 VITALS — BP 120/80 | Ht 64.0 in | Wt 180.0 lb

## 2020-03-04 DIAGNOSIS — R102 Pelvic and perineal pain: Secondary | ICD-10-CM

## 2020-03-04 DIAGNOSIS — N7011 Chronic salpingitis: Secondary | ICD-10-CM

## 2020-03-04 DIAGNOSIS — Z01818 Encounter for other preprocedural examination: Secondary | ICD-10-CM | POA: Insufficient documentation

## 2020-03-04 HISTORY — DX: Gastro-esophageal reflux disease without esophagitis: K21.9

## 2020-03-04 HISTORY — DX: Unspecified asthma, uncomplicated: J45.909

## 2020-03-04 HISTORY — DX: Family history of other specified conditions: Z84.89

## 2020-03-04 HISTORY — DX: Cardiac arrhythmia, unspecified: I49.9

## 2020-03-04 NOTE — Progress Notes (Signed)
PRE-OPERATIVE HISTORY AND PHYSICAL EXAM  HPI:  Alison Frazier is a 39 y.o. G0P0000 No LMP recorded.; she is being admitted for surgery related to pelvic pain and hydrosalpinx of right tube.   Her pain is localized to the RLQ area, described as sharp and stabbing, intermittent and but now more daily and constant, began several months ago and its severity is described as severe. The pain radiates to the  right back. She has these associated symptoms which include none. Patient has these modifiers which include nothing that make it better and nothing that make it worse.  Context includes: spontaneous.  Worse w ovulation timing at first, now all the time  Previous evaluation: ultrasound showing right sided hydrosalpinx in Feb and Apr. Prior Laparoscopies for myomectomy, appendectomy, and ovarian cystectomy Desires fertility.  Has seen REI.  Considering IVF.  PMHx: Past Medical History:  Diagnosis Date  . Chlamydia   . Family history of colon cancer in father   . Leiomyoma   . Ovarian cyst    Past Surgical History:  Procedure Laterality Date  . APPENDECTOMY    . DILATION AND CURETTAGE OF UTERUS    . dnc    . INFERIOR OBLIQUE MYECTOMY    . OVARIAN CYST REMOVAL     Family History  Problem Relation Age of Onset  . Stroke Mother   . Diabetes Father   . Hypertension Father   . Colon cancer Father   . Throat cancer Father   . Breast cancer Neg Hx   . Ovarian cancer Neg Hx    Social History   Tobacco Use  . Smoking status: Never Smoker  . Smokeless tobacco: Never Used  Substance Use Topics  . Alcohol use: Not Currently  . Drug use: Not Currently    Current Outpatient Medications:  .  diclofenac (VOLTAREN) 75 MG EC tablet, Take 1 tablet (75 mg total) by mouth 2 (two) times daily as needed., Disp: 60 tablet, Rfl: 3 .  meloxicam (MOBIC) 7.5 MG tablet, Take 7.5 mg by mouth 2 (two) times daily as needed for pain., Disp: , Rfl:  .  oxyCODONE-acetaminophen (PERCOCET/ROXICET) 5-325 MG  tablet, Take 1 tablet by mouth every 4 (four) hours as needed for severe pain., Disp: 20 tablet, Rfl: 0 Allergies: Contrast media [iodinated diagnostic agents], Iodine, Shellfish-derived products, and Other  Review of Systems  Constitutional: Negative for chills, fever and malaise/fatigue.  HENT: Negative for congestion, sinus pain and sore throat.   Eyes: Negative for blurred vision and pain.  Respiratory: Negative for cough and wheezing.   Cardiovascular: Negative for chest pain and leg swelling.  Gastrointestinal: Positive for abdominal pain. Negative for constipation, diarrhea, heartburn, nausea and vomiting.  Genitourinary: Negative for dysuria, frequency, hematuria and urgency.  Musculoskeletal: Negative for back pain, joint pain, myalgias and neck pain.  Skin: Negative for itching and rash.  Neurological: Negative for dizziness, tremors and weakness.  Endo/Heme/Allergies: Does not bruise/bleed easily.  Psychiatric/Behavioral: Negative for depression. The patient is not nervous/anxious and does not have insomnia.     Objective: There were no vitals taken for this visit. There were no vitals filed for this visit. Physical Exam Constitutional:      General: She is not in acute distress.    Appearance: She is well-developed.  HENT:     Head: Normocephalic and atraumatic. No laceration.     Right Ear: Hearing normal.     Left Ear: Hearing normal.     Mouth/Throat:  Pharynx: Uvula midline.  Eyes:     Pupils: Pupils are equal, round, and reactive to light.  Neck:     Thyroid: No thyromegaly.  Cardiovascular:     Rate and Rhythm: Normal rate and regular rhythm.     Heart sounds: No murmur. No friction rub. No gallop.   Pulmonary:     Effort: Pulmonary effort is normal. No respiratory distress.     Breath sounds: Normal breath sounds. No wheezing.  Chest:     Breasts:        Right: No mass, skin change or tenderness.        Left: No mass, skin change or tenderness.    Abdominal:     General: Bowel sounds are normal. There is no distension.     Palpations: Abdomen is soft.     Tenderness: There is no abdominal tenderness. There is no rebound.  Musculoskeletal:        General: Normal range of motion.     Cervical back: Normal range of motion and neck supple.  Neurological:     Mental Status: She is alert and oriented to person, place, and time.     Cranial Nerves: No cranial nerve deficit.  Skin:    General: Skin is warm and dry.  Psychiatric:        Judgment: Judgment normal.  Vitals reviewed.     Assessment: 1. Hydrosalpinx on right  2. Pelvic pain  Especially RLQ pain  Plan laparoscopy and removal of right tube if hydrosalpinx or pathology found.  Also will perform chromopertubation to assess left tube. Pros and cons of this procedure discussed w pt.  Fertility after this surgery based on findings discussed as well.    I have had a careful discussion with this patient about all the options available and the risk/benefits of each. I have fully informed this patient that surgery may subject her to a variety of discomforts and risks: She understands that most patients have surgery with little difficulty, but problems can happen ranging from minor to fatal. These include nausea, vomiting, pain, bleeding, infection, poor healing, hernia, or formation of adhesions. Unexpected reactions may occur from any drug or anesthetic given. Unintended injury may occur to other pelvic or abdominal structures such as Fallopian tubes, ovaries, bladder, ureter (tube from kidney to bladder), or bowel. Nerves going from the pelvis to the legs may be injured. Any such injury may require immediate or later additional surgery to correct the problem. Excessive blood loss requiring transfusion is very unlikely but possible. Dangerous blood clots may form in the legs or lungs. Physical and sexual activity will be restricted in varying degrees for an indeterminate period of time  but most often 2-6 weeks.  Finally, she understands that it is impossible to list every possible undesirable effect and that the condition for which surgery is done is not always cured or significantly improved, and in rare cases may be even worse.Ample time was given to answer all questions.  Barnett Applebaum, MD, [facility], [facility] 03/04/2020  10:37 AM

## 2020-03-04 NOTE — Patient Instructions (Signed)
PRE ADMISSION TESTING For Covid, prior to procedure Tuesday 9:00-10:00 [facility] entrance (drive up)  Results in 48-72 hours You will not receive notification if test results are negative. If positive for Covid19, your provider will notify you by phone, with additional instructions.   Diagnostic Laparoscopy, Care After This sheet gives you information about how to care for yourself after your procedure. Your health care provider may also give you more specific instructions. If you have problems or questions, contact your health care provider. What can I expect after the procedure? After the procedure, it is common to have:  Mild discomfort in the abdomen.  Sore throat. Women who have laparoscopy with pelvic examination may have mild cramping and fluid coming from the vagina for a few days after the procedure. Follow these instructions at home: Medicines  Take over-the-counter and prescription medicines only as told by your health care provider.  If you were prescribed an antibiotic medicine, take it as told by your health care provider. Do not stop taking the antibiotic even if you start to feel better. Driving  Do not drive for 24 hours if you were given a medicine to help you relax (sedative) during your procedure.  Do not drive or use heavy machinery while taking prescription pain medicine. Bathing  Do not take baths, swim, or use a hot tub until your health care provider approves. You may take showers. Incision care   Follow instructions from your health care provider about how to take care of your incisions. Make sure you: ? Wash your hands with soap and water before you change your bandage (dressing). If soap and water are not available, use hand sanitizer. ? Change your dressing as told by your health care provider. ? Leave stitches (sutures), skin glue, or adhesive strips in place. These skin closures may need to stay in place for 2 weeks or longer. If  adhesive strip edges start to loosen and curl up, you may trim the loose edges. Do not remove adhesive strips completely unless your health care provider tells you to do that.  Check your incision areas every day for signs of infection. Check for: ? Redness, swelling, or pain. ? Fluid or blood. ? Warmth. ? Pus or a bad smell. Activity  Return to your normal activities as told by your health care provider. Ask your health care provider what activities are safe for you.  Do not lift anything that is heavier than 10 lb (4.5 kg), or the limit that you are told, until your health care provider says that it is safe. General instructions  To prevent or treat constipation while you are taking prescription pain medicine, your health care provider may recommend that you: ? Drink enough fluid to keep your urine pale yellow. ? Take over-the-counter or prescription medicines. ? Eat foods that are high in fiber, such as fresh fruits and vegetables, whole grains, and beans. ? Limit foods that are high in fat and processed sugars, such as fried and sweet foods.  Do not use any products that contain nicotine or tobacco, such as cigarettes and e-cigarettes. If you need help quitting, ask your health care provider.  Keep all follow-up visits as told by your health care provider. This is important. Contact a health care provider if:  You develop shoulder pain.  You feel lightheaded or faint.  You are unable to pass gas or have a bowel movement.  You feel nauseous or you vomit.  You develop a rash.  You  have redness, swelling, or pain around any incision.  You have fluid or blood coming from any incision.  Any incision feels warm to the touch.  You have pus or a bad smell coming from any incision.  You have a fever or chills. Get help right away if:  You have severe pain.  You have vomiting that does not go away.  You have heavy bleeding from the vagina.  Any incision opens.  You  have trouble breathing.  You have chest pain. Summary  After the procedure, it is common to have mild discomfort in the abdomen and a sore throat.  Check your incision areas every day for signs of infection.  Return to your normal activities as told by your health care provider. Ask your health care provider what activities are safe for you. This information is not intended to replace advice given to you by your health care provider. Make sure you discuss any questions you have with your health care provider. Document Revised: 08/27/2017 Document Reviewed: 03/10/2017 Elsevier Patient Education  [employer].

## 2020-03-04 NOTE — H&P (View-Only) (Signed)
PRE-OPERATIVE HISTORY AND PHYSICAL EXAM  HPI:  Alison Frazier is a 39 y.o. G0P0000 No LMP recorded.; she is being admitted for surgery related to pelvic pain and hydrosalpinx of right tube.   Her pain is localized to the RLQ area, described as sharp and stabbing, intermittent and but now more daily and constant, began several months ago and its severity is described as severe. The pain radiates to the  right back. She has these associated symptoms which include none. Patient has these modifiers which include nothing that make it better and nothing that make it worse.  Context includes: spontaneous.  Worse w ovulation timing at first, now all the time  Previous evaluation: ultrasound showing right sided hydrosalpinx in Feb and Apr. Prior Laparoscopies for myomectomy, appendectomy, and ovarian cystectomy Desires fertility.  Has seen REI.  Considering IVF.  PMHx: Past Medical History:  Diagnosis Date  . Chlamydia   . Family history of colon cancer in father   . Leiomyoma   . Ovarian cyst    Past Surgical History:  Procedure Laterality Date  . APPENDECTOMY    . DILATION AND CURETTAGE OF UTERUS    . dnc    . INFERIOR OBLIQUE MYECTOMY    . OVARIAN CYST REMOVAL     Family History  Problem Relation Age of Onset  . Stroke Mother   . Diabetes Father   . Hypertension Father   . Colon cancer Father   . Throat cancer Father   . Breast cancer Neg Hx   . Ovarian cancer Neg Hx    Social History   Tobacco Use  . Smoking status: Never Smoker  . Smokeless tobacco: Never Used  Substance Use Topics  . Alcohol use: Not Currently  . Drug use: Not Currently    Current Outpatient Medications:  .  diclofenac (VOLTAREN) 75 MG EC tablet, Take 1 tablet (75 mg total) by mouth 2 (two) times daily as needed., Disp: 60 tablet, Rfl: 3 .  meloxicam (MOBIC) 7.5 MG tablet, Take 7.5 mg by mouth 2 (two) times daily as needed for pain., Disp: , Rfl:  .  oxyCODONE-acetaminophen (PERCOCET/ROXICET) 5-325 MG  tablet, Take 1 tablet by mouth every 4 (four) hours as needed for severe pain., Disp: 20 tablet, Rfl: 0 Allergies: Contrast media [iodinated diagnostic agents], Iodine, Shellfish-derived products, and Other  Review of Systems  Constitutional: Negative for chills, fever and malaise/fatigue.  HENT: Negative for congestion, sinus pain and sore throat.   Eyes: Negative for blurred vision and pain.  Respiratory: Negative for cough and wheezing.   Cardiovascular: Negative for chest pain and leg swelling.  Gastrointestinal: Positive for abdominal pain. Negative for constipation, diarrhea, heartburn, nausea and vomiting.  Genitourinary: Negative for dysuria, frequency, hematuria and urgency.  Musculoskeletal: Negative for back pain, joint pain, myalgias and neck pain.  Skin: Negative for itching and rash.  Neurological: Negative for dizziness, tremors and weakness.  Endo/Heme/Allergies: Does not bruise/bleed easily.  Psychiatric/Behavioral: Negative for depression. The patient is not nervous/anxious and does not have insomnia.     Objective: There were no vitals taken for this visit. There were no vitals filed for this visit. Physical Exam Constitutional:      General: She is not in acute distress.    Appearance: She is well-developed.  HENT:     Head: Normocephalic and atraumatic. No laceration.     Right Ear: Hearing normal.     Left Ear: Hearing normal.     Mouth/Throat:  Pharynx: Uvula midline.  Eyes:     Pupils: Pupils are equal, round, and reactive to light.  Neck:     Thyroid: No thyromegaly.  Cardiovascular:     Rate and Rhythm: Normal rate and regular rhythm.     Heart sounds: No murmur. No friction rub. No gallop.   Pulmonary:     Effort: Pulmonary effort is normal. No respiratory distress.     Breath sounds: Normal breath sounds. No wheezing.  Chest:     Breasts:        Right: No mass, skin change or tenderness.        Left: No mass, skin change or tenderness.    Abdominal:     General: Bowel sounds are normal. There is no distension.     Palpations: Abdomen is soft.     Tenderness: There is no abdominal tenderness. There is no rebound.  Musculoskeletal:        General: Normal range of motion.     Cervical back: Normal range of motion and neck supple.  Neurological:     Mental Status: She is alert and oriented to person, place, and time.     Cranial Nerves: No cranial nerve deficit.  Skin:    General: Skin is warm and dry.  Psychiatric:        Judgment: Judgment normal.  Vitals reviewed.     Assessment: 1. Hydrosalpinx on right  2. Pelvic pain  Especially RLQ pain  Plan laparoscopy and removal of right tube if hydrosalpinx or pathology found.  Also will perform chromopertubation to assess left tube. Pros and cons of this procedure discussed w pt.  Fertility after this surgery based on findings discussed as well.    I have had a careful discussion with this patient about all the options available and the risk/benefits of each. I have fully informed this patient that surgery may subject her to a variety of discomforts and risks: She understands that most patients have surgery with little difficulty, but problems can happen ranging from minor to fatal. These include nausea, vomiting, pain, bleeding, infection, poor healing, hernia, or formation of adhesions. Unexpected reactions may occur from any drug or anesthetic given. Unintended injury may occur to other pelvic or abdominal structures such as Fallopian tubes, ovaries, bladder, ureter (tube from kidney to bladder), or bowel. Nerves going from the pelvis to the legs may be injured. Any such injury may require immediate or later additional surgery to correct the problem. Excessive blood loss requiring transfusion is very unlikely but possible. Dangerous blood clots may form in the legs or lungs. Physical and sexual activity will be restricted in varying degrees for an indeterminate period of time  but most often 2-6 weeks.  Finally, she understands that it is impossible to list every possible undesirable effect and that the condition for which surgery is done is not always cured or significantly improved, and in rare cases may be even worse.Ample time was given to answer all questions.  Barnett Applebaum, MD, [facility], [facility] 03/04/2020  10:37 AM

## 2020-03-04 NOTE — Patient Instructions (Signed)
Your procedure is scheduled on: Thurs 6/17 Report to Day Surgery. To find out your arrival time please call [phone] between 1PM - 3PM on Wed. 6/16  Remember: Instructions that are not followed completely may result in serious medical risk,  up to and including death, or upon the discretion of your surgeon and anesthesiologist your  surgery may need to be rescheduled.     _X__ 1. Do not eat food after midnight the night before your procedure.                 No gum chewing or hard candies. You may drink clear liquids up to 2 hours                 before you are scheduled to arrive for your surgery- DO not drink clear                 liquids within 2 hours of the start of your surgery.                 Clear Liquids include:  water, apple juice without pulp, clear Gatorade, G2 or                  Gatorade Zero (avoid Red/Purple/Blue), Black Coffee or Tea (Do not add                 anything to coffee or tea). _____2.   Complete the carbohydrate drink provided to you, 2 hours before arrival.  __X__2.  On the morning of surgery brush your teeth with toothpaste and water, you                may rinse your mouth with mouthwash if you wish.  Do not swallow any toothpaste of mouthwash.     _X__ 3.  No Alcohol for 24 hours before or after surgery.   _X__ 4.  Do Not Smoke or use e-cigarettes For 24 Hours Prior to Your Surgery.                 Do not use any chewable tobacco products for at least 6 hours prior to                 Surgery.  _X__  5.  Do not use any recreational drugs (marijuana, cocaine, heroin, ecstacy, MDMA or other)                For at least one week prior to your surgery.  Combination of these drugs with anesthesia                May have life threatening results.  ____  6.  Bring all medications with you on the day of surgery if instructed.   _x___  7.  Notify your doctor if there is any change in your medical condition      (cold, fever,  infections).     Do not wear jewelry, make-up, hairpins, clips or nail polish. Do not wear lotions, powders, or perfumes. You may wear deodorant. Do not shave 48 hours prior to surgery.  Do not bring valuables to the hospital.    [facility] is not responsible for any belongings or valuables.  Contacts, dentures or bridgework may not be worn into surgery. Leave your suitcase in the car. After surgery it may be brought to your room. For patients admitted to the hospital, discharge time is determined by your treatment team.   Patients discharged  the day of surgery will not be allowed to drive home.   Make arrangements for someone to be with you for the first 24 hours of your Same Day Discharge.  See Incentive spirometer instructions.  Device will be given to you the day of surgery   Please read over the following fact sheets that you were given:   ___x_ Take these medicines the morning of surgery with A SIP OF WATER:    1. Percocet or tylenol if needed  2.   3.   4.  5.  6.  ____ Fleet Enema (as directed)   _x___ Use CHG Soap (or wipes) as directed  ____ Use Benzoyl Peroxide Gel as instructed  ____ Use inhalers on the day of surgery  ____ Stop metformin 2 days prior to surgery    ____ Take 1/2 of usual insulin dose the night before surgery. No insulin the morning          of surgery.   ____ Stop Coumadin/Plavix/aspirin on   __x__ Stop Anti-inflammatories diclofenac (VOLTAREN) 75 MG EC tablet, meloxicam (MOBIC) 7.5 MG tablet ibuprofen aleve or aspirin products after 6/10 May take tylenol or percocet   ____ Stop supplements until after surgery.    ____ Bring C-Pap to the hospital.

## 2020-03-12 ENCOUNTER — Other Ambulatory Visit: Payer: Self-pay

## 2020-03-12 ENCOUNTER — Other Ambulatory Visit
Admission: RE | Admit: 2020-03-12 | Discharge: 2020-03-12 | Disposition: A | Discharge status: Home / Self Care | Payer: BC Managed Care – PPO | Source: Ambulatory Visit | Attending: Obstetrics & Gynecology | Admitting: Obstetrics & Gynecology

## 2020-03-12 DIAGNOSIS — Z20822 Contact with and (suspected) exposure to covid-19: Secondary | ICD-10-CM | POA: Diagnosis not present

## 2020-03-12 DIAGNOSIS — Z01812 Encounter for preprocedural laboratory examination: Secondary | ICD-10-CM | POA: Insufficient documentation

## 2020-03-12 LAB — CBC
HCT: 37 % (ref 36.0–46.0)
Hemoglobin: 12.7 g/dL (ref 12.0–15.0)
MCH: 26.8 pg (ref 26.0–34.0)
MCHC: 34.3 g/dL (ref 30.0–36.0)
MCV: 78.1 fL — ABNORMAL LOW (ref 80.0–100.0)
Platelets: 338 10*3/uL (ref 150–400)
RBC: 4.74 MIL/uL (ref 3.87–5.11)
RDW: 12.7 % (ref 11.5–15.5)
WBC: 3.7 10*3/uL — ABNORMAL LOW (ref 4.0–10.5)
nRBC: 0 % (ref 0.0–0.2)

## 2020-03-12 LAB — TYPE AND SCREEN
ABO/RH(D): AB POS
Antibody Screen: NEGATIVE

## 2020-03-12 LAB — SARS CORONAVIRUS 2 (TAT 6-24 HRS): SARS Coronavirus 2: NEGATIVE

## 2020-03-14 ENCOUNTER — Other Ambulatory Visit: Payer: Self-pay

## 2020-03-14 ENCOUNTER — Ambulatory Visit: Payer: BC Managed Care – PPO

## 2020-03-14 ENCOUNTER — Encounter: Payer: Self-pay | Admitting: Obstetrics & Gynecology

## 2020-03-14 ENCOUNTER — Inpatient Hospital Stay
Admission: AD | Admit: 2020-03-14 | Discharge: 2020-03-22 | DRG: 742 | Disposition: A | Discharge status: Home / Self Care | Payer: BC Managed Care – PPO | Source: Ambulatory Visit | Attending: Obstetrics and Gynecology | Admitting: Obstetrics and Gynecology

## 2020-03-14 ENCOUNTER — Encounter
Admission: AD | Disposition: A | Discharge status: Home / Self Care | Payer: Self-pay | Source: Ambulatory Visit | Attending: Obstetrics and Gynecology

## 2020-03-14 DIAGNOSIS — Z91041 Radiographic dye allergy status: Secondary | ICD-10-CM

## 2020-03-14 DIAGNOSIS — K9189 Other postprocedural complications and disorders of digestive system: Secondary | ICD-10-CM | POA: Diagnosis present

## 2020-03-14 DIAGNOSIS — N7011 Chronic salpingitis: Secondary | ICD-10-CM | POA: Diagnosis not present

## 2020-03-14 DIAGNOSIS — R198 Other specified symptoms and signs involving the digestive system and abdomen: Secondary | ICD-10-CM

## 2020-03-14 DIAGNOSIS — K631 Perforation of intestine (nontraumatic): Secondary | ICD-10-CM | POA: Diagnosis present

## 2020-03-14 DIAGNOSIS — R102 Pelvic and perineal pain: Secondary | ICD-10-CM | POA: Diagnosis not present

## 2020-03-14 DIAGNOSIS — R630 Anorexia: Secondary | ICD-10-CM | POA: Diagnosis present

## 2020-03-14 DIAGNOSIS — Z9889 Other specified postprocedural states: Secondary | ICD-10-CM

## 2020-03-14 DIAGNOSIS — K567 Ileus, unspecified: Secondary | ICD-10-CM

## 2020-03-14 HISTORY — PX: LAPAROSCOPIC UNILATERAL SALPINGO OOPHERECTOMY: SHX5935

## 2020-03-14 HISTORY — PX: LAPAROSCOPIC LYSIS OF ADHESIONS: SHX5905

## 2020-03-14 HISTORY — PX: CHROMOPERTUBATION: SHX6288

## 2020-03-14 LAB — POCT PREGNANCY, URINE: Preg Test, Ur: NEGATIVE

## 2020-03-14 LAB — ABO/RH: ABO/RH(D): AB POS

## 2020-03-14 SURGERY — SALPINGO-OOPHORECTOMY, UNILATERAL, LAPAROSCOPIC
Anesthesia: General

## 2020-03-14 MED ORDER — HYDROCODONE-ACETAMINOPHEN 7.5-325 MG PO TABS
ORAL_TABLET | ORAL | Status: AC
  Administered 2020-03-14: 1
  Filled 2020-03-14: qty 1

## 2020-03-14 MED ORDER — METHYLENE BLUE 0.5 % INJ SOLN
INTRAVENOUS | Status: DC | PRN
  Administered 2020-03-14: 2 mL

## 2020-03-14 MED ORDER — IBUPROFEN 800 MG PO TABS
800.0000 mg | ORAL_TABLET | Freq: Four times a day (QID) | ORAL | Status: DC
  Administered 2020-03-15 – 2020-03-18 (×11): 800 mg via ORAL
  Filled 2020-03-14 (×10): qty 1

## 2020-03-14 MED ORDER — ONDANSETRON HCL 4 MG PO TABS
4.0000 mg | ORAL_TABLET | Freq: Four times a day (QID) | ORAL | Status: DC | PRN
  Administered 2020-03-17: 4 mg via ORAL
  Filled 2020-03-14: qty 1

## 2020-03-14 MED ORDER — SUCCINYLCHOLINE CHLORIDE 20 MG/ML IJ SOLN
INTRAMUSCULAR | Status: DC | PRN
  Administered 2020-03-14: 120 mg via INTRAVENOUS

## 2020-03-14 MED ORDER — CHLORHEXIDINE GLUCONATE 0.12 % MT SOLN
15.0000 mL | Freq: Once | OROMUCOSAL | Status: AC
  Administered 2020-03-14: 15 mL via OROMUCOSAL

## 2020-03-14 MED ORDER — HYDROCODONE-ACETAMINOPHEN 7.5-325 MG PO TABS
1.0000 | ORAL_TABLET | Freq: Once | ORAL | Status: AC | PRN
  Administered 2020-03-14: 1 via ORAL
  Filled 2020-03-14: qty 1

## 2020-03-14 MED ORDER — LACTATED RINGERS IV SOLN: INTRAVENOUS | Status: DC

## 2020-03-14 MED ORDER — ORAL CARE MOUTH RINSE: 15.0000 mL | Freq: Once | OROMUCOSAL | Status: AC

## 2020-03-14 MED ORDER — MIDAZOLAM HCL 2 MG/2ML IJ SOLN
INTRAMUSCULAR | Status: DC | PRN
  Administered 2020-03-14: 2 mg via INTRAVENOUS

## 2020-03-14 MED ORDER — OXYCODONE-ACETAMINOPHEN 5-325 MG PO TABS: 1.0000 | ORAL_TABLET | ORAL | Status: DC | PRN

## 2020-03-14 MED ORDER — ROCURONIUM BROMIDE 100 MG/10ML IV SOLN
INTRAVENOUS | Status: DC | PRN
  Administered 2020-03-14: 20 mg via INTRAVENOUS

## 2020-03-14 MED ORDER — MORPHINE SULFATE (PF) 2 MG/ML IV SOLN: 1.0000 mg | INTRAVENOUS | Status: DC | PRN

## 2020-03-14 MED ORDER — MEPERIDINE HCL 50 MG/ML IJ SOLN: 6.2500 mg | INTRAMUSCULAR | Status: DC | PRN

## 2020-03-14 MED ORDER — FAMOTIDINE 20 MG PO TABS
ORAL_TABLET | ORAL | Status: AC
  Administered 2020-03-14: 20 mg via ORAL
  Filled 2020-03-14: qty 1

## 2020-03-14 MED ORDER — PROPOFOL 10 MG/ML IV BOLUS
INTRAVENOUS | Status: DC | PRN
  Administered 2020-03-14: 120 mg via INTRAVENOUS

## 2020-03-14 MED ORDER — IBUPROFEN 800 MG PO TABS: 800.0000 mg | ORAL_TABLET | Freq: Four times a day (QID) | ORAL | Status: DC

## 2020-03-14 MED ORDER — ONDANSETRON HCL 4 MG/2ML IJ SOLN
4.0000 mg | Freq: Four times a day (QID) | INTRAMUSCULAR | Status: DC | PRN
  Administered 2020-03-14 – 2020-03-20 (×7): 4 mg via INTRAVENOUS
  Filled 2020-03-14 (×7): qty 2

## 2020-03-14 MED ORDER — BISACODYL 10 MG RE SUPP
10.0000 mg | Freq: Every day | RECTAL | Status: DC | PRN
  Administered 2020-03-17: 10 mg via RECTAL
  Filled 2020-03-14: qty 1

## 2020-03-14 MED ORDER — OXYCODONE-ACETAMINOPHEN 5-325 MG PO TABS
1.0000 | ORAL_TABLET | ORAL | Status: DC | PRN
  Administered 2020-03-15 (×2): 2 via ORAL
  Administered 2020-03-15 (×2): 1 via ORAL
  Administered 2020-03-15: 2 via ORAL
  Administered 2020-03-16: 1 via ORAL
  Administered 2020-03-16: 2 via ORAL
  Administered 2020-03-16: 1 via ORAL
  Filled 2020-03-14 (×4): qty 1
  Filled 2020-03-14 (×4): qty 2
  Filled 2020-03-14: qty 1

## 2020-03-14 MED ORDER — OXYCODONE-ACETAMINOPHEN 5-325 MG PO TABS: 1.0000 | ORAL_TABLET | ORAL | 0 refills | Status: DC | PRN

## 2020-03-14 MED ORDER — FENTANYL CITRATE (PF) 100 MCG/2ML IJ SOLN
INTRAMUSCULAR | Status: AC
  Filled 2020-03-14: qty 2

## 2020-03-14 MED ORDER — MORPHINE SULFATE (PF) 4 MG/ML IV SOLN
INTRAVENOUS | Status: AC
  Administered 2020-03-14: 1 mg
  Filled 2020-03-14: qty 1

## 2020-03-14 MED ORDER — KETOROLAC TROMETHAMINE 30 MG/ML IJ SOLN
30.0000 mg | Freq: Once | INTRAMUSCULAR | Status: AC | PRN
  Administered 2020-03-14: 30 mg via INTRAVENOUS

## 2020-03-14 MED ORDER — BUPIVACAINE HCL (PF) 0.5 % IJ SOLN
INTRAMUSCULAR | Status: DC | PRN
  Administered 2020-03-14: 7 mL
  Administered 2020-03-14: 4 mL

## 2020-03-14 MED ORDER — DEXAMETHASONE SODIUM PHOSPHATE 10 MG/ML IJ SOLN
INTRAMUSCULAR | Status: DC | PRN
  Administered 2020-03-14: 10 mg via INTRAVENOUS

## 2020-03-14 MED ORDER — ACETAMINOPHEN 325 MG PO TABS: 650.0000 mg | ORAL_TABLET | ORAL | Status: DC | PRN

## 2020-03-14 MED ORDER — SUCCINYLCHOLINE CHLORIDE 200 MG/10ML IV SOSY
PREFILLED_SYRINGE | INTRAVENOUS | Status: AC
  Filled 2020-03-14: qty 10

## 2020-03-14 MED ORDER — PROMETHAZINE HCL 25 MG/ML IJ SOLN: 6.2500 mg | INTRAMUSCULAR | Status: DC | PRN

## 2020-03-14 MED ORDER — FENTANYL CITRATE (PF) 100 MCG/2ML IJ SOLN
25.0000 ug | INTRAMUSCULAR | Status: DC | PRN
  Administered 2020-03-14 (×2): 50 ug via INTRAVENOUS

## 2020-03-14 MED ORDER — METHYLENE BLUE 0.5 % INJ SOLN
INTRAVENOUS | Status: AC
  Filled 2020-03-14: qty 10

## 2020-03-14 MED ORDER — LACTATED RINGERS IV SOLN
INTRAVENOUS | Status: DC
  Administered 2020-03-14: 25 mL via INTRAVENOUS

## 2020-03-14 MED ORDER — CHLORHEXIDINE GLUCONATE 0.12 % MT SOLN
OROMUCOSAL | Status: AC
  Filled 2020-03-14: qty 15

## 2020-03-14 MED ORDER — SIMETHICONE 80 MG PO CHEW
80.0000 mg | CHEWABLE_TABLET | Freq: Four times a day (QID) | ORAL | Status: DC | PRN
  Administered 2020-03-16 – 2020-03-22 (×11): 80 mg via ORAL
  Filled 2020-03-14 (×13): qty 1

## 2020-03-14 MED ORDER — ONDANSETRON HCL 4 MG/2ML IJ SOLN
INTRAMUSCULAR | Status: DC | PRN
  Administered 2020-03-14: 4 mg via INTRAVENOUS

## 2020-03-14 MED ORDER — HYDROCODONE-ACETAMINOPHEN 7.5-325 MG PO TABS: 1.0000 | ORAL_TABLET | Freq: Once | ORAL | Status: DC

## 2020-03-14 MED ORDER — ONDANSETRON HCL 4 MG/2ML IJ SOLN
INTRAMUSCULAR | Status: AC
  Filled 2020-03-14: qty 2

## 2020-03-14 MED ORDER — KETOROLAC TROMETHAMINE 30 MG/ML IJ SOLN: 30.0000 mg | Freq: Four times a day (QID) | INTRAMUSCULAR | Status: DC

## 2020-03-14 MED ORDER — ACETAMINOPHEN 325 MG PO TABS: 325.0000 mg | ORAL_TABLET | ORAL | Status: DC | PRN

## 2020-03-14 MED ORDER — MIDAZOLAM HCL 2 MG/2ML IJ SOLN
INTRAMUSCULAR | Status: AC
  Filled 2020-03-14: qty 2

## 2020-03-14 MED ORDER — PROPOFOL 10 MG/ML IV BOLUS
INTRAVENOUS | Status: AC
  Filled 2020-03-14: qty 20

## 2020-03-14 MED ORDER — BUPIVACAINE HCL (PF) 0.5 % IJ SOLN
INTRAMUSCULAR | Status: AC
  Filled 2020-03-14: qty 30

## 2020-03-14 MED ORDER — ACETAMINOPHEN 650 MG RE SUPP
650.0000 mg | RECTAL | Status: DC | PRN
  Filled 2020-03-14: qty 1

## 2020-03-14 MED ORDER — MORPHINE SULFATE (PF) 2 MG/ML IV SOLN
1.0000 mg | INTRAVENOUS | Status: DC | PRN
  Administered 2020-03-14 – 2020-03-18 (×6): 2 mg via INTRAVENOUS
  Filled 2020-03-14 (×7): qty 1

## 2020-03-14 MED ORDER — ACETAMINOPHEN 160 MG/5ML PO SOLN
325.0000 mg | ORAL | Status: DC | PRN
  Filled 2020-03-14: qty 20.3

## 2020-03-14 MED ORDER — KETOROLAC TROMETHAMINE 30 MG/ML IJ SOLN
30.0000 mg | Freq: Four times a day (QID) | INTRAMUSCULAR | Status: AC
  Administered 2020-03-14 – 2020-03-15 (×3): 30 mg via INTRAVENOUS
  Filled 2020-03-14 (×3): qty 1

## 2020-03-14 MED ORDER — FAMOTIDINE 20 MG PO TABS: 20.0000 mg | ORAL_TABLET | Freq: Once | ORAL | Status: AC

## 2020-03-14 MED ORDER — ROCURONIUM BROMIDE 10 MG/ML (PF) SYRINGE
PREFILLED_SYRINGE | INTRAVENOUS | Status: AC
  Filled 2020-03-14: qty 10

## 2020-03-14 MED ORDER — LIDOCAINE HCL (CARDIAC) PF 100 MG/5ML IV SOSY
PREFILLED_SYRINGE | INTRAVENOUS | Status: DC | PRN
  Administered 2020-03-14: 80 mg via INTRAVENOUS

## 2020-03-14 MED ORDER — KETOROLAC TROMETHAMINE 30 MG/ML IJ SOLN
INTRAMUSCULAR | Status: AC
  Filled 2020-03-14: qty 1

## 2020-03-14 MED ORDER — HYDROCODONE-ACETAMINOPHEN 7.5-325 MG PO TABS
ORAL_TABLET | ORAL | Status: AC
  Filled 2020-03-14: qty 1

## 2020-03-14 MED ORDER — FENTANYL CITRATE (PF) 100 MCG/2ML IJ SOLN
INTRAMUSCULAR | Status: DC | PRN
  Administered 2020-03-14: 25 ug via INTRAVENOUS
  Administered 2020-03-14: 50 ug via INTRAVENOUS
  Administered 2020-03-14: 25 ug via INTRAVENOUS

## 2020-03-14 MED ORDER — SUGAMMADEX SODIUM 200 MG/2ML IV SOLN
INTRAVENOUS | Status: DC | PRN
  Administered 2020-03-14: 150 mg via INTRAVENOUS
  Administered 2020-03-14: 50 mg via INTRAVENOUS

## 2020-03-14 MED ORDER — ACETAMINOPHEN 325 MG PO TABS
650.0000 mg | ORAL_TABLET | ORAL | Status: DC | PRN
  Administered 2020-03-14 – 2020-03-17 (×4): 650 mg via ORAL
  Filled 2020-03-14 (×6): qty 2

## 2020-03-14 MED ORDER — SODIUM CHLORIDE 0.9 % IV SOLN
100.0000 mg | Freq: Once | INTRAVENOUS | Status: AC
  Administered 2020-03-14: 100 mg via INTRAVENOUS
  Filled 2020-03-14: qty 100

## 2020-03-14 SURGICAL SUPPLY — 51 items
BAG URINE DRAIN 2000ML AR STRL (UROLOGICAL SUPPLIES) ×3 IMPLANT
BLADE SURG SZ11 CARB STEEL (BLADE) ×3 IMPLANT
CANISTER SUCT 1200ML W/VALVE (MISCELLANEOUS) ×3 IMPLANT
CANISTER SUCT 3000ML PPV (MISCELLANEOUS) ×3 IMPLANT
CATH FOLEY 2WAY  5CC 16FR (CATHETERS) ×1
CATH ROBINSON RED A/P 16FR (CATHETERS) ×3 IMPLANT
CATH URTH 16FR FL 2W BLN LF (CATHETERS) ×2 IMPLANT
CHLORAPREP W/TINT 26 (MISCELLANEOUS) ×3 IMPLANT
COVER WAND RF STERILE (DRAPES) ×3 IMPLANT
DERMABOND ADVANCED (GAUZE/BANDAGES/DRESSINGS) ×1
DERMABOND ADVANCED .7 DNX12 (GAUZE/BANDAGES/DRESSINGS) ×2 IMPLANT
DRSG TELFA 4X3 1S NADH ST (GAUZE/BANDAGES/DRESSINGS) IMPLANT
GLOVE BIO SURGEON STRL SZ8 (GLOVE) ×6 IMPLANT
GLOVE INDICATOR 8.0 STRL GRN (GLOVE) ×3 IMPLANT
GOWN STRL REUS W/ TWL LRG LVL3 (GOWN DISPOSABLE) ×4 IMPLANT
GOWN STRL REUS W/ TWL XL LVL3 (GOWN DISPOSABLE) ×2 IMPLANT
GOWN STRL REUS W/TWL LRG LVL3 (GOWN DISPOSABLE) ×2
GOWN STRL REUS W/TWL XL LVL3 (GOWN DISPOSABLE) ×1
GRASPER SUT TROCAR 14GX15 (MISCELLANEOUS) ×3 IMPLANT
IRRIGATION STRYKERFLOW (MISCELLANEOUS) IMPLANT
IRRIGATOR STRYKERFLOW (MISCELLANEOUS)
IV LACTATED RINGERS 1000ML (IV SOLUTION) ×3 IMPLANT
KIT PINK PAD W/HEAD ARE REST (MISCELLANEOUS) ×3
KIT PINK PAD W/HEAD ARM REST (MISCELLANEOUS) ×2 IMPLANT
KIT TURNOVER CYSTO (KITS) ×3 IMPLANT
LABEL OR SOLS (LABEL) ×3 IMPLANT
MANIPULATOR UTERINE 7CM CLEARV (MISCELLANEOUS) ×3 IMPLANT
NEEDLE VERESS 14GA 120MM (NEEDLE) ×3 IMPLANT
NS IRRIG 500ML POUR BTL (IV SOLUTION) ×3 IMPLANT
PACK DNC HYST (MISCELLANEOUS) ×3 IMPLANT
PACK GYN LAPAROSCOPIC (MISCELLANEOUS) ×3 IMPLANT
PAD OB MATERNITY 4.3X12.25 (PERSONAL CARE ITEMS) ×3 IMPLANT
PAD PREP 24X41 OB/GYN DISP (PERSONAL CARE ITEMS) ×3 IMPLANT
POUCH SPECIMEN RETRIEVAL 10MM (ENDOMECHANICALS) IMPLANT
SCISSORS METZENBAUM CVD 33 (INSTRUMENTS) IMPLANT
SET TUBE SMOKE EVAC HIGH FLOW (TUBING) ×3 IMPLANT
SHEARS HARMONIC ACE PLUS 36CM (ENDOMECHANICALS) ×3 IMPLANT
SLEEVE ENDOPATH XCEL 5M (ENDOMECHANICALS) ×3 IMPLANT
SPONGE GAUZE 2X2 8PLY STRL LF (GAUZE/BANDAGES/DRESSINGS) IMPLANT
STRAP SAFETY 5IN WIDE (MISCELLANEOUS) ×3 IMPLANT
SUT VIC AB 0 CT1 36 (SUTURE) ×3 IMPLANT
SUT VIC AB 2-0 UR6 27 (SUTURE) IMPLANT
SUT VIC AB 4-0 FS2 27 (SUTURE) ×3 IMPLANT
SUT VIC AB 4-0 PS2 18 (SUTURE) IMPLANT
SUT VICRYL 0 AB UR-6 (SUTURE) ×3 IMPLANT
SYR 10ML LL (SYRINGE) ×3 IMPLANT
SYSTEM WECK SHIELD CLOSURE (TROCAR) IMPLANT
TOWEL OR 17X26 4PK STRL BLUE (TOWEL DISPOSABLE) ×3 IMPLANT
TROCAR ENDO BLADELESS 11MM (ENDOMECHANICALS) IMPLANT
TROCAR XCEL NON-BLD 5MMX100MML (ENDOMECHANICALS) ×3 IMPLANT
TUBING ART PRESS 48 MALE/FEM (TUBING) ×3 IMPLANT

## 2020-03-14 NOTE — OR Nursing (Signed)
Dr. Lavone Neri called about patient's pain being 10 out of 10 and he recommended giving another pain pill Norco 7.5/325 mg.  Dr. Kenton Kingfisher notified as well and he came to speak with patient.

## 2020-03-14 NOTE — Interval H&P Note (Signed)
History and Physical Interval Note:  03/14/2020 8:40 AM  Alison Frazier  has presented today for surgery, with the diagnosis of Pelvic pain R10.2 Hydrosalpinx N70.11.  The various methods of treatment have been discussed with the patient and family. After consideration of risks, benefits and other options for treatment, the patient has consented to  Procedure(s): OPERATIVE LAPAROSCOPY W POSSIBLE RIGHT SALPINGECTOMY AND CHROMOPERTUBATION (N/A) CHROMOPERTUBATION (N/A) as a surgical intervention.  The patient's history has been reviewed, patient examined, no change in status, stable for surgery.  I have reviewed the patient's chart and labs.  Questions were answered to the patient's satisfaction.     Hoyt Koch

## 2020-03-14 NOTE — Op Note (Addendum)
  Operative Note   03/14/2020  PRE-OP DIAGNOSIS: Right Hydrosalpinx, RLQ pelvic pain  POST-OP DIAGNOSIS: same, plus adhesive disease   PROCEDURE: Procedure(s): OPERATIVE LAPAROSCOPY RIGHT SALPINGECTOMY CHROMOPERTUBATION LAPAROSCOPIC LYSIS OF ADHESIONS   SURGEON: Barnett Applebaum, MD, FACOG  ANESTHESIA: Choice   ESTIMATED BLOOD LOSS: Minimal  COMPLICATIONS: None  DISPOSITION: PACU - hemodynamically stable.  CONDITION: stable  FINDINGS: Laparoscopic survey of the abdomen revealed adhesions surrounding the uterus, tubes, ovaries, also with some adhesions of small intestines to anterior abdoimnal wall.  Hydrosalpinx of right tubes throughout length.  Left tube normal.  Ovaries normal although adherent to uterus.  PROCEDURE IN DETAIL: The patient was taken to the OR where anesthesia was administed. The patient was positioned in dorsal lithotomy in the [place]. The patient was then examined under anesthesia with the above noted findings. The patient was prepped and draped in the normal sterile fashion and bladder was drained using a red rubber cathater. Speculum exam normal, and a uterine manipulator was placed with a channel to inject methelyene blue dye ready.  Attention was turned to the patient's abdomen where a 5 mm skin incision was made in the umbilical fold, after injection of local anesthesia. The Veress step needle was carefully introduced into the peritoneal cavity with placement confirmed using the hanging drop technique.  Pneumoperitoneum was obtained. The 5 mm port was then placed under direct visualization with the operative laparoscope  The above noted findings.  Trendelenburg.  A 5 mm trocar was then placed in the right lower quadrant under direct visualization with the laparoscope.  Lysis of adhesions is performed especially as it pertains to the anterior abdominal wall to carefully dissect the intestines away, with the Harmonic scapel, no injuries noted.  A 11 mm trocar  is placed in the suprapubic region.  Right tube is identified as abnormal and followed out to their fimbria.  Tube is excised utilizing the Harmonic scapel to include the fibria.  No injuries or bleeding was noted.  Tube is removed with an endopouch.  Methylene blue dye is injected at the cervix through the manipulator, with dye then seen exiting through the left fallopian tube freely.  No tubal anomalies on this side visualized.  All instruments and ports were then removed from the abdomen after gas was expelled and patient was leveled.   The skin was closed with skin adhesive. The patient tolerated the procedure well. All counts were correct x 2. The patient was transferred to the recovery room awake, alert and breathing independently.  Barnett Applebaum, MD, [facility], [facility] 03/14/2020  10:48 AM

## 2020-03-14 NOTE — Anesthesia Postprocedure Evaluation (Signed)
Anesthesia Post Note  Patient: Rondalyn Belford  Procedure(s) Performed: OPERATIVE LAPAROSCOPY RIGHT SALPINGECTOMY AND CHROMOPERTUBATION (N/A ) CHROMOPERTUBATION (N/A ) LAPAROSCOPIC LYSIS OF ADHESIONS  Patient location during evaluation: PACU Anesthesia Type: General Level of consciousness: awake and alert Pain management: pain level controlled Vital Signs Assessment: post-procedure vital signs reviewed and stable Respiratory status: spontaneous breathing, nonlabored ventilation, respiratory function stable and patient connected to nasal cannula oxygen Cardiovascular status: blood pressure returned to baseline and stable Postop Assessment: no apparent nausea or vomiting Anesthetic complications: no   No complications documented.   Last Vitals:  Vitals:   03/14/20 1234 03/14/20 1249  BP: 138/71   Pulse: 71 66  Resp: (!) 23 (!) 22  Temp:    SpO2: 100% 98%    Last Pain:  Vitals:   03/14/20 1234  TempSrc:   PainSc: 10-Worst pain ever                 Alphonsus Sias

## 2020-03-14 NOTE — Anesthesia Preprocedure Evaluation (Addendum)
Anesthesia Evaluation  Patient identified by MRN, date of birth, ID band Patient awake    Reviewed: Allergy & Precautions, H&P , NPO status , reviewed documented beta blocker date and time   History of Anesthesia Complications (+) Family history of anesthesia reaction  Airway Mallampati: II  TM Distance: >3 FB Neck ROM: full    Dental  (+) Teeth Intact   Pulmonary asthma ,  Rare inhaler   Pulmonary exam normal        Cardiovascular Normal cardiovascular exam+ dysrhythmias      Neuro/Psych    GI/Hepatic GERD  Controlled,  Endo/Other    Renal/GU      Musculoskeletal   Abdominal   Peds  Hematology   Anesthesia Other Findings Past Medical History: No date: Asthma     Comment:  inhalers in the past No date: Chlamydia No date: Dysrhythmia     Comment:  PVC's No date: Family history of adverse reaction to anesthesia     Comment:  mother died during hip fracture surgery. Discussed at length w pt, reassurance given since pt has personally received GOT anesthesia 3 times without incident.  No date: Family history of colon cancer in father No date: GERD (gastroesophageal reflux disease)     Comment:  not recent No date: Leiomyoma No date: Ovarian cyst Past Surgical History: No date: APPENDECTOMY No date: DILATION AND CURETTAGE OF UTERUS No date: dnc No date: INFERIOR OBLIQUE MYECTOMY No date: OVARIAN CYST REMOVAL   Reproductive/Obstetrics                            Anesthesia Physical Anesthesia Plan  ASA: II  Anesthesia Plan: General   Post-op Pain Management:    Induction: Intravenous  PONV Risk Score and Plan: Ondansetron and Treatment may vary due to age or medical condition  Airway Management Planned: Oral ETT  Additional Equipment:   Intra-op Plan:   Post-operative Plan: Extubation in OR  Informed Consent: I have reviewed the patients History and Physical, chart,  labs and discussed the procedure including the risks, benefits and alternatives for the proposed anesthesia with the patient or authorized representative who has indicated his/her understanding and acceptance.     Dental Advisory Given  Plan Discussed with: CRNA  Anesthesia Plan Comments:         Anesthesia Quick Evaluation

## 2020-03-14 NOTE — Anesthesia Procedure Notes (Addendum)
Procedure Name: Intubation Date/Time: 03/14/2020 9:36 AM Performed by: Nolon Lennert, RN Pre-anesthesia Checklist: Patient identified, Emergency Drugs available, Suction available and Patient being monitored Patient Re-evaluated:Patient Re-evaluated prior to induction Oxygen Delivery Method: Circle system utilized Preoxygenation: Pre-oxygenation with 100% oxygen Induction Type: IV induction Ventilation: Mask ventilation without difficulty Laryngoscope Size: Mac and 3 Grade View: Grade II Tube type: Oral Tube size: 7.0 mm Number of attempts: 1 Airway Equipment and Method: Stylet Placement Confirmation: ETT inserted through vocal cords under direct vision,  positive ETCO2 and breath sounds checked- equal and bilateral Secured at: 21 cm Tube secured with: Tape Dental Injury: Teeth and Oropharynx as per pre-operative assessment

## 2020-03-14 NOTE — Progress Notes (Signed)
Day of Surgery Procedure(s) (LRB): OPERATIVE LAPAROSCOPY RIGHT SALPINGECTOMY AND CHROMOPERTUBATION (N/A) CHROMOPERTUBATION (N/A) LAPAROSCOPIC LYSIS OF ADHESIONS  Subjective: Patient reports pain worse than she expected, 10/10 at times.  No nausea, fever.  Pain in lower abdomen below umbilicus and also radiating to back.    Objective: I have reviewed patient's vital signs, intake and output and medications.  Abd: Min T esp in area of surgery and where known adhesions to anterior abd wall were located, ND and no rigidity, rebound, or guarding Incision: clean, dry and intact Extr: no calf T, no edema  Assessment: s/p Procedure(s): OPERATIVE LAPAROSCOPY RIGHT SALPINGECTOMY AND CHROMOPERTUBATION (N/A) CHROMOPERTUBATION (N/A) LAPAROSCOPIC LYSIS OF ADHESIONS: having more pain than desired so will monitor in observation bed/room and assess pain levels and ability to have appetite an dtakes po's  Plan: Encourage ambulation Monitor pain  Diet as able  Due to adhesions, risks of pain and also bowel compromise discussed.  Will assess further as indicated.   LOS: 0 days    Hoyt Koch 03/14/2020, 1:22 PM

## 2020-03-14 NOTE — OR Nursing (Signed)
Patient had to use the bathroom, took patient to restroom, states pain is 10 out 10, she walks to restroom well and uses bathroom, hoping to relieve pressure.  Patient is in recliner now sleeping and states pain is still a 10 out of 10 while sleeping. No signs of distress noted.

## 2020-03-14 NOTE — OR Nursing (Signed)
Called DR. Harris, he advised he was OK with IV rate at 25ml/hr instead of ordered 939ml/hr

## 2020-03-14 NOTE — Transfer of Care (Signed)
Immediate Anesthesia Transfer of Care Note  Patient: Alison Frazier  Procedure(s) Performed: OPERATIVE LAPAROSCOPY RIGHT SALPINGECTOMY AND CHROMOPERTUBATION (N/A ) CHROMOPERTUBATION (N/A ) LAPAROSCOPIC LYSIS OF ADHESIONS  Patient Location: PACU  Anesthesia Type:General  Level of Consciousness: drowsy and patient cooperative  Airway & Oxygen Therapy: Patient Spontanous Breathing and Patient connected to face mask oxygen  Post-op Assessment: Report given to RN and Post -op Vital signs reviewed and stable  Post vital signs: Reviewed and stable  Last Vitals:  Vitals Value Taken Time  BP 144/70 03/14/20 1049  Temp 36.1 C 03/14/20 1049  Pulse 67 03/14/20 1051  Resp 23 03/14/20 1051  SpO2 100 % 03/14/20 1051  Vitals shown include unvalidated device data.  Last Pain:  Vitals:   03/14/20 0839  TempSrc: Tympanic  PainSc: 0-No pain         Complications: No complications documented.

## 2020-03-14 NOTE — OR Nursing (Signed)
Dr. Kenton Kingfisher going to keep patient for a while upstairs for observation and possible overnight.  Patient is still having pain gave some morphine and updated her husband.  Awaiting a room assignment now

## 2020-03-14 NOTE — Progress Notes (Signed)
Day of Surgery Procedure(s) (LRB): OPERATIVE LAPAROSCOPY RIGHT SALPINGECTOMY AND CHROMOPERTUBATION (N/A) CHROMOPERTUBATION (N/A) LAPAROSCOPIC LYSIS OF ADHESIONS  Subjective: Patient reports pain in abdomen comes and goes.  Mostly lower abdomen.  Some radiation to back.  Mild nausea but no emesis.  No fever..    Objective: I have reviewed patient's vital signs, intake and output, medications and labs.  Abd: Mild T esp lower quadrants, ND Incision: clean, dry and intact Extr: no calf T, no edema  Assessment: s/p Procedure(s): OPERATIVE LAPAROSCOPY RIGHT SALPINGECTOMY AND CHROMOPERTUBATION (N/A) CHROMOPERTUBATION (N/A) LAPAROSCOPIC LYSIS OF ADHESIONS: doing well but due to pain more so than expected will observe overnight and monitor for worsening, or for GI sx's.  Plan: Advance diet Encourage ambulation Monitor pain and GI sx's    LOS: 0 days    Hoyt Koch 03/14/2020, 4:36 PM

## 2020-03-15 ENCOUNTER — Encounter: Payer: Self-pay | Admitting: Obstetrics & Gynecology

## 2020-03-15 LAB — SURGICAL PATHOLOGY

## 2020-03-15 NOTE — Progress Notes (Signed)
1 Day Post-Op Procedure(s) (LRB): OPERATIVE LAPAROSCOPY RIGHT SALPINGECTOMY AND CHROMOPERTUBATION (N/A) LAPAROSCOPIC LYSIS OF ADHESIONS  Subjective: Patient reports pain in mid abdomen, a little better today but not much; mostly lower abdomen.  She has been resting mostly.  Ambulates to void.  No flatus or BM.  No nausea through the night or this am.  Belches at times.  .    Objective: I have reviewed patient's vital signs, intake and output and medications.  Abd: Min T, ND, hypoactive BS Incision: clean, dry and intact Extr: no calf T, no edema  Assessment: s/p Procedure(s): OPERATIVE LAPAROSCOPY RIGHT SALPINGECTOMY AND CHROMOPERTUBATION (N/A) CHROMOPERTUBATION (N/A) LAPAROSCOPIC LYSIS OF ADHESIONS: stable  Plan: Encourage ambulation Monitor for bowel sounds, flatus; may develop ileus based on adhesions around small intestines.    Prefer improved pain and bowel function prior to discharge Incisions doing well, wound care discussed   LOS: 0 days    Hoyt Koch 03/15/2020, 7:57 AM

## 2020-03-15 NOTE — Progress Notes (Signed)
Daily Benign Gynecology Progress Note Alison Frazier  [id number]  HD#2/POD#1  Subjective:  Overnight Events: no acute events Complaints: none currently. She is belching some She denies: fevers, chills, chest pain, trouble breathing, nausea, vomiting, severe abdominal pain.  She has tolerated: liquids and some small amount of solids. She reports her pain is well controlled with oral pain medication.   She is ambulating and is voiding.  She has not passed flatus.   Objective:  Most recent vitals Temp: 98.4 F (36.9 C)  BP: (!) 143/77  Pulse Rate: 64  Resp: 18  SpO2: 99 %   Vitals Range over 24 hours Temp  Avg: 98.3 F (36.8 C)  Min: 98.1 F (36.7 C)  Max: 98.5 F (36.9 C) BP  Min: 138/70  Max: 153/75 Pulse  Avg: 72.6  Min: 64  Max: 82 SpO2  Avg: 97.4 %  Min: 95 %  Max: 100 %   Physical Exam General: alert, well appearing, and in no distress Heart: regular rate and rhythm, no murmurs Lungs: clear to auscultation, no wheezes, rales or rhonchi, symmetric air entry Abdomen: abdomen is soft without significant tenderness, masses, organomegaly or guarding. +BS, minimal to no distention.  Extremities: no redness or tenderness in the calves or thighs, no edema  AM Labs Lab Results  Component Value Date   WBC 3.7 (L) 03/12/2020   HGB 12.7 03/12/2020   HCT 37.0 03/12/2020   PLT 338 03/12/2020     Assessment:  Alison Frazier is a 39 y.o. female POD#1 s/p OPERATIVE LAPAROSCOPY RIGHT SALPINGECTOMY AND CHROMOPERTUBATION (N/A), LAPAROSCOPIC LYSIS OF ADHESIONS.  She is advancing slowly.   Plan:  Awaiting return of bowel function with passage of flatus due to manipulation of bowels in surgery.   DVT Prophylaxis:  SCDs Activity: as tolerated. Encouraged ambulatin Anticipated Discharge: tonight or tomorrow  Prentice Docker, MD  03/15/2020 2:14 PM

## 2020-03-16 ENCOUNTER — Observation Stay: Payer: BC Managed Care – PPO

## 2020-03-16 LAB — CBC
HCT: 34.8 % — ABNORMAL LOW (ref 36.0–46.0)
Hemoglobin: 11.6 g/dL — ABNORMAL LOW (ref 12.0–15.0)
MCH: 27.2 pg (ref 26.0–34.0)
MCHC: 33.3 g/dL (ref 30.0–36.0)
MCV: 81.7 fL (ref 80.0–100.0)
Platelets: 283 10*3/uL (ref 150–400)
RBC: 4.26 MIL/uL (ref 3.87–5.11)
RDW: 12.9 % (ref 11.5–15.5)
WBC: 7.8 10*3/uL (ref 4.0–10.5)
nRBC: 0 % (ref 0.0–0.2)

## 2020-03-16 LAB — COMPREHENSIVE METABOLIC PANEL
ALT: 18 U/L (ref 0–44)
AST: 22 U/L (ref 15–41)
Albumin: 3.6 g/dL (ref 3.5–5.0)
Alkaline Phosphatase: 53 U/L (ref 38–126)
Anion gap: 12 (ref 5–15)
BUN: 7 mg/dL (ref 6–20)
CO2: 24 mmol/L (ref 22–32)
Calcium: 8.4 mg/dL — ABNORMAL LOW (ref 8.9–10.3)
Chloride: 100 mmol/L (ref 98–111)
Creatinine, Ser: 0.84 mg/dL (ref 0.44–1.00)
GFR calc Af Amer: >60 mL/min (ref >60)
GFR calc non Af Amer: >60 mL/min (ref >60)
Glucose, Bld: 102 mg/dL — ABNORMAL HIGH (ref 70–99)
Potassium: 3.5 mmol/L (ref 3.5–5.1)
Sodium: 136 mmol/L (ref 135–145)
Total Bilirubin: 1.4 mg/dL — ABNORMAL HIGH (ref 0.3–1.2)
Total Protein: 6.9 g/dL (ref 6.5–8.1)

## 2020-03-16 MED ORDER — DEXTROSE-NACL 5-0.45 % IV SOLN: INTRAVENOUS | Status: DC

## 2020-03-16 MED ORDER — HYDROMORPHONE HCL 2 MG PO TABS
2.0000 mg | ORAL_TABLET | ORAL | Status: DC | PRN
  Administered 2020-03-16 (×3): 4 mg via ORAL
  Administered 2020-03-17 (×2): 2 mg via ORAL
  Filled 2020-03-16 (×2): qty 2
  Filled 2020-03-16 (×2): qty 1
  Filled 2020-03-16: qty 2

## 2020-03-16 NOTE — Progress Notes (Signed)
Addendum: CT scan results reviewed with patient as well as recent labs.  CT scan normal other than some feculent material in distal small bowl suggestive of decreased transit time, no significant small bowl distention to suggest obstruction.  Small volume of free air in upper right quadrant consistent with recent laparoscopy.  CBC and CMP normal.  Results for orders placed or performed during the hospital encounter of 03/14/20 (from the past 24 hour(s))  CBC     Status: Abnormal   Collection Time: 03/16/20  9:20 AM  Result Value Ref Range   WBC 7.8 4.0 - 10.5 K/uL   RBC 4.26 3.87 - 5.11 MIL/uL   Hemoglobin 11.6 (L) 12.0 - 15.0 g/dL   HCT 34.8 (L) 36 - 46 %   MCV 81.7 80.0 - 100.0 fL   MCH 27.2 26.0 - 34.0 pg   MCHC 33.3 30.0 - 36.0 g/dL   RDW 12.9 11.5 - 15.5 %   Platelets 283 150 - 400 K/uL   nRBC 0.0 0.0 - 0.2 %  Comprehensive metabolic panel     Status: Abnormal   Collection Time: 03/16/20  9:20 AM  Result Value Ref Range   Sodium 136 135 - 145 mmol/L   Potassium 3.5 3.5 - 5.1 mmol/L   Chloride 100 98 - 111 mmol/L   CO2 24 22 - 32 mmol/L   Glucose, Bld 102 (H) 70 - 99 mg/dL   BUN 7 6 - 20 mg/dL   Creatinine, Ser 0.84 0.44 - 1.00 mg/dL   Calcium 8.4 (L) 8.9 - 10.3 mg/dL   Total Protein 6.9 6.5 - 8.1 g/dL   Albumin 3.6 3.5 - 5.0 g/dL   AST 22 15 - 41 U/L   ALT 18 0 - 44 U/L   Alkaline Phosphatase 53 38 - 126 U/L   Total Bilirubin 1.4 (H) 0.3 - 1.2 mg/dL   GFR calc non Af Amer >60 >60 mL/min   GFR calc Af Amer >60 >60 mL/min   Anion gap 12 5 - 15   CT ABDOMEN PELVIS WO CONTRAST  Result Date: 03/16/2020 CLINICAL DATA:  39 year old female with a history of a abdominal pain post laparoscopy/surgery EXAM: CT ABDOMEN AND PELVIS WITHOUT CONTRAST TECHNIQUE: Multidetector CT imaging of the abdomen and pelvis was performed following the standard protocol without IV contrast. COMPARISON:  No prior CT FINDINGS: Lower chest: Atelectatic changes at the bilateral lung bases Hepatobiliary:  Unremarkable liver.  Unremarkable gallbladder. Pancreas: Unremarkable Spleen: Unremarkable Adrenals/Urinary Tract: - Right adrenal gland:  Unremarkable - Left adrenal gland: Unremarkable. - Right kidney: No hydronephrosis, nephrolithiasis, inflammation, or ureteral dilation. No focal lesion. - Left Kidney: No hydronephrosis, nephrolithiasis, inflammation, or ureteral dilation. No focal lesion. - Urinary Bladder: Urinary bladder mostly decompressed Stomach/Bowel: - Stomach: Unremarkable. - Small bowel: Relatively decompressed. Single loop with air-fluid level, nondistended. Partially fecalized small bowel within the distal small bowel, just proximal to the terminal ileum. There is no discrete transition point identified, although somewhat limited with the absence of IV contrast. - Appendix: Appendix is not visualized, however, no inflammatory changes are present adjacent to the cecum to indicate an appendicitis. - Colon: Small fecal stool burden within the colon. No significant distention. Distal colon relatively decompressed. Vascular/Lymphatic: Unremarkable arterial and venous vasculature. Unremarkable portal vasculature. No adenopathy Reproductive: Unremarkable uterus. Adnexa not well evaluated status post pelvic surgery. There is low volume of intermediate density fluid layered adjacent to the urinary bladder and in the recto uterine space. Other: Small volume of free intraperitoneal  air in the upper abdomen. Surgical changes of the abdominal wall. Musculoskeletal: No acute displaced fracture IMPRESSION: Partially fecalized small bowel in the distal ileum suggest slow motility. A bowel obstruction is not favored at this point, given that there are no dilated loops more proximally. If there is ongoing concern, repeat CT with IV contrast may be considered, for a more sensitive/specific interpretation of the bowel loops. Early surgical changes of laparoscopic gyn surgery, with expected low volume postsurgical  fluid/blood products within the anatomic pelvis, as well as small volume of intraperitoneal air. Electronically Signed   By: Corrie Mckusick D.O.   On: 03/16/2020 09:47

## 2020-03-16 NOTE — Progress Notes (Signed)
Subjective: Patient reports no flatus, nausea and no problems voiding.  Pain is not well-controlled on current  analgesic regimen..  Tolerating po: no only had some cracker, tea, and grapes yesterday.  Still significant eructation.  She has ambulated some this morning.  Objective: Vital signs in last 24 hours: Temp:  [98.3 F (36.8 C)-98.8 F (37.1 C)] 98.3 F (36.8 C) (06/19 0740) Pulse Rate:  [58-73] 69 (06/19 0740) Resp:  [16-20] 18 (06/19 0740) BP: (124-156)/(70-85) 128/72 (06/19 0740) SpO2:  [93 %-99 %] 96 % (06/19 0740)    Intake/Output from previous day: 06/18 0701 - 06/19 0700 In: 1707.4 [P.O.:1080; I.V.:627.4] Out: 1975 [Urine:1975]  Physical Examination: General: NAD Pulmonary: no increased work of breathing Abdomen: Absent bowl sounds, soft, mildly tender, mildly distended, tympanic to percusion incision(s) D/C/I Extremities: no edema Neurologic: normal gait   Labs: Results for orders placed or performed during the hospital encounter of 03/14/20 (from the past 72 hour(s))  Pregnancy, urine POC     Status: None   Collection Time: 03/14/20  8:40 AM  Result Value Ref Range   Preg Test, Ur NEGATIVE NEGATIVE    Comment:        THE SENSITIVITY OF THIS METHODOLOGY IS >24 mIU/mL   ABO/Rh     Status: None   Collection Time: 03/14/20  9:19 AM  Result Value Ref Range   ABO/RH(D)      AB POS Performed at [facility], [address] [place], [place] [address]   Surgical pathology     Status: None   Collection Time: 03/14/20 10:11 AM  Result Value Ref Range   SURGICAL PATHOLOGY      SURGICAL PATHOLOGY CASE: [phone] PATIENT: Tennis Ship Surgical Pathology Report     Specimen Submitted: A. Tube, right  Clinical History: Pelvic pain R10.2, hydrosalpinx N70.11     DIAGNOSIS: A.  FALLOPIAN TUBE, RIGHT; SALPINGECTOMY: - CHRONIC HYDROSALPINX. - NO ACTIVE INFLAMMATION OR CHANGES SUGGESTIVE OF ENDOMETRIOSIS.  GROSS DESCRIPTION: A.  Labeled: Right tube Received: Formalin Tissue fragment(s): 1 Size: 10.5 cm long and ranges from 0.5 to 1.6 cm in diameter Description: The serosa is purple-tan and smooth with a 0.2 x 0.2 cm defect.  Fimbria are absent.  The lumen is diffusely dilated and ranges from 0.1 to 1.5 cm in diameter.  Additionally, the lumen has a blue tint. The area with the greatest dilation has a thinned wall, 0.1 cm. No additional masses or lesions are grossly identified. Representative cross-sections are submitted in cassettes 1-2.   Final Diagnosis performed by Bryan Lemma, MD.   Electronically signed 03/15/2020 5:54:29PM T he electronic signature indicates that the named Attending Pathologist has evaluated the specimen Technical component performed at [facility], [address] [place], [place] [address] Lab: [phone] Dir: Rush Farmer, MD, MMM  Professional component performed at [facility], [facility], [place], [place], [place] [address] Lab: [phone] Dir: Dellia Nims. Rubinas, MD      Assessment:  39 y.o. s/p 2 Days Post-Op Procedure(s) (LRB): OPERATIVE LAPAROSCOPY RIGHT SALPINGECTOMY AND CHROMOPERTUBATION (N/A) CHROMOPERTUBATION (N/A) LAPAROSCOPIC LYSIS OF ADHESIONS : ileus present  Plan: 1) Pain: not well controlled on current oral regimen.  Reports requiring morphine pushes overnight.   - Will switch from percocet to dilaudid po to see if better control with this agent - Given lack of return of bowl function in anticipated time frame, as well as greater than anticipated pain at this point in her postoperative course will obtain CBC, CMP, and  protein/creatnine ratio  2) Heme: hemodynamically stable  3) FEN: if normal labs and imaging work on continuing to increase po intake and tolerance.  Today.    4) Prophylaxis: ambulating.   Malachy Mood, MD, [facility], [facility] 03/16/2020, 8:43 AM

## 2020-03-16 NOTE — Progress Notes (Signed)
Patient to CT via bed and transport.

## 2020-03-16 NOTE — Progress Notes (Signed)
Subjective: Patient reports continued abdominal pain, still no flatus.  Pain is better controlled on current  analgesic regimen..  Tolerating po: minimal  Objective: Vital signs in last 24 hours: Temp:  [98.1 F (36.7 C)-98.9 F (37.2 C)] 98.9 F (37.2 C) (06/19 1941) Pulse Rate:  [69-79] 75 (06/19 1941) Resp:  [16-18] 18 (06/19 1941) BP: (124-147)/(70-88) 147/77 (06/19 1941) SpO2:  [94 %-96 %] 94 % (06/19 1941)    Intake/Output from previous day: 06/18 0701 - 06/19 0700 In: 1707.4 [P.O.:1080; I.V.:627.4] Out: 1975 [Urine:1975]  Physical Examination: General: NAD Pulmonary: no increased work of breathing Abdomen: hypoactive bowl sound slightly more active than this morning, soft, appropriately tender, mildly distended,Tympanic to percussion, incision(s) D/C/I Extremities: no edema Neurologic: normal gait   Labs: Results for orders placed or performed during the hospital encounter of 03/14/20 (from the past 72 hour(s))  Pregnancy, urine POC     Status: None   Collection Time: 03/14/20  8:40 AM  Result Value Ref Range   Preg Test, Ur NEGATIVE NEGATIVE    Comment:        THE SENSITIVITY OF THIS METHODOLOGY IS >24 mIU/mL   ABO/Rh     Status: None   Collection Time: 03/14/20  9:19 AM  Result Value Ref Range   ABO/RH(D)      AB POS Performed at [facility], [address] [place], [place] [address]   Surgical pathology     Status: None   Collection Time: 03/14/20 10:11 AM  Result Value Ref Range   SURGICAL PATHOLOGY      SURGICAL PATHOLOGY CASE: [id number] PATIENT: Alison Frazier Surgical Pathology Report     Specimen Submitted: A. Tube, right  Clinical History: Pelvic pain R10.2, hydrosalpinx N70.11     DIAGNOSIS: A.  FALLOPIAN TUBE, RIGHT; SALPINGECTOMY: - CHRONIC HYDROSALPINX. - NO ACTIVE INFLAMMATION OR CHANGES SUGGESTIVE OF ENDOMETRIOSIS.  GROSS DESCRIPTION: A. Labeled: Right tube Received: Formalin Tissue fragment(s): 1 Size:  10.5 cm long and ranges from 0.5 to 1.6 cm in diameter Description: The serosa is purple-tan and smooth with a 0.2 x 0.2 cm defect.  Fimbria are absent.  The lumen is diffusely dilated and ranges from 0.1 to 1.5 cm in diameter.  Additionally, the lumen has a blue tint. The area with the greatest dilation has a thinned wall, 0.1 cm. No additional masses or lesions are grossly identified. Representative cross-sections are submitted in cassettes 1-2.   Final Diagnosis performed by Bryan Lemma, MD.   Electronically signed 03/15/2020 5:54:29PM T he electronic signature indicates that the named Attending Pathologist has evaluated the specimen Technical component performed at [facility], [address], [place], [place] [address] Lab: [phone] Dir: Rush Farmer, MD, MMM  Professional component performed at [facility], [place], [place], [place] [address] Lab: [phone] Dir: Dellia Nims. Rubinas, MD   CBC     Status: Abnormal   Collection Time: 03/16/20  9:20 AM  Result Value Ref Range   WBC 7.8 4.0 - 10.5 K/uL   RBC 4.26 3.87 - 5.11 MIL/uL   Hemoglobin 11.6 (L) 12.0 - 15.0 g/dL   HCT 34.8 (L) 36 - 46 %   MCV 81.7 80.0 - 100.0 fL   MCH 27.2 26.0 - 34.0 pg   MCHC 33.3 30.0 - 36.0 g/dL   RDW 12.9 11.5 - 15.5 %   Platelets 283 150 - 400 K/uL   nRBC 0.0 0.0 - 0.2 %    Comment: Performed at [facility], [place], [place] [address]  Comprehensive metabolic panel     Status: Abnormal   Collection Time: 03/16/20  9:20 AM  Result Value Ref Range   Sodium 136 135 - 145 mmol/L   Potassium 3.5 3.5 - 5.1 mmol/L   Chloride 100 98 - 111 mmol/L   CO2 24 22 - 32 mmol/L   Glucose, Bld 102 (H) 70 - 99 mg/dL    Comment: Glucose reference range applies only to samples taken after fasting for at least 8 hours.   BUN 7 6 - 20 mg/dL   Creatinine, Ser 0.84 0.44 - 1.00 mg/dL   Calcium 8.4 (L) 8.9 - 10.3 mg/dL   Total Protein 6.9 6.5 - 8.1 g/dL    Albumin 3.6 3.5 - 5.0 g/dL   AST 22 15 - 41 U/L   ALT 18 0 - 44 U/L   Alkaline Phosphatase 53 38 - 126 U/L   Total Bilirubin 1.4 (H) 0.3 - 1.2 mg/dL   GFR calc non Af Amer >60 >60 mL/min   GFR calc Af Amer >60 >60 mL/min   Anion gap 12 5 - 15    Comment: Performed at [facility], [address] [place], [place] [address]     Assessment:  39 y.o. s/p 2 Days Post-Op Procedure(s) (LRB): OPERATIVE LAPAROSCOPY RIGHT SALPINGECTOMY AND CHROMOPERTUBATION (N/A) CHROMOPERTUBATION (N/A) LAPAROSCOPIC LYSIS OF ADHESIONS : stable  Plan: 1) Pain:  - improved with switch from percocet to po dilaudid - CT A/P normal postoperative but limited given lack of contrast secondary to patient contrast allergy - repeat CBC in AM  2) Heme: hemodynamically stable and afebrile  3) FEN: - po as tolerated - on D5 1/2 NS at 34mL/hr maintenance  4) Prophylaxis: intermittent pneumatic compression boots. Added as ambulating but minimal still  5) Disposition: pending return of bowl function  Malachy Mood, MD, [place], [facility] 03/16/2020, 8:15 PM

## 2020-03-17 ENCOUNTER — Encounter
Admission: AD | Disposition: A | Discharge status: Home / Self Care | Payer: Self-pay | Source: Ambulatory Visit | Attending: Obstetrics and Gynecology

## 2020-03-17 ENCOUNTER — Observation Stay: Payer: BC Managed Care – PPO

## 2020-03-17 ENCOUNTER — Observation Stay: Payer: BC Managed Care – PPO | Admitting: Registered Nurse

## 2020-03-17 ENCOUNTER — Encounter: Payer: Self-pay | Admitting: Obstetrics & Gynecology

## 2020-03-17 DIAGNOSIS — K567 Ileus, unspecified: Secondary | ICD-10-CM | POA: Diagnosis present

## 2020-03-17 DIAGNOSIS — Z91041 Radiographic dye allergy status: Secondary | ICD-10-CM | POA: Diagnosis not present

## 2020-03-17 DIAGNOSIS — R198 Other specified symptoms and signs involving the digestive system and abdomen: Secondary | ICD-10-CM | POA: Diagnosis not present

## 2020-03-17 DIAGNOSIS — R630 Anorexia: Secondary | ICD-10-CM | POA: Diagnosis present

## 2020-03-17 DIAGNOSIS — K631 Perforation of intestine (nontraumatic): Secondary | ICD-10-CM | POA: Diagnosis present

## 2020-03-17 DIAGNOSIS — N7011 Chronic salpingitis: Secondary | ICD-10-CM | POA: Diagnosis present

## 2020-03-17 DIAGNOSIS — K9189 Other postprocedural complications and disorders of digestive system: Secondary | ICD-10-CM | POA: Diagnosis present

## 2020-03-17 DIAGNOSIS — Z9889 Other specified postprocedural states: Secondary | ICD-10-CM | POA: Diagnosis not present

## 2020-03-17 DIAGNOSIS — R102 Pelvic and perineal pain: Secondary | ICD-10-CM | POA: Diagnosis present

## 2020-03-17 DIAGNOSIS — R1031 Right lower quadrant pain: Secondary | ICD-10-CM | POA: Diagnosis present

## 2020-03-17 HISTORY — PX: SMALL BOWEL REPAIR: SHX6447

## 2020-03-17 HISTORY — PX: LAPAROSCOPY: SHX197

## 2020-03-17 LAB — CBC
HCT: 33.7 % — ABNORMAL LOW (ref 36.0–46.0)
Hemoglobin: 11.2 g/dL — ABNORMAL LOW (ref 12.0–15.0)
MCH: 26.8 pg (ref 26.0–34.0)
MCHC: 33.2 g/dL (ref 30.0–36.0)
MCV: 80.6 fL (ref 80.0–100.0)
Platelets: 277 10*3/uL (ref 150–400)
RBC: 4.18 MIL/uL (ref 3.87–5.11)
RDW: 12.7 % (ref 11.5–15.5)
WBC: 10.7 10*3/uL — ABNORMAL HIGH (ref 4.0–10.5)
nRBC: 0 % (ref 0.0–0.2)

## 2020-03-17 SURGERY — LAPAROSCOPY, DIAGNOSTIC
Anesthesia: General | Site: Abdomen

## 2020-03-17 MED ORDER — PHENYLEPHRINE HCL (PRESSORS) 10 MG/ML IV SOLN
INTRAVENOUS | Status: DC | PRN
  Administered 2020-03-17: 100 ug via INTRAVENOUS

## 2020-03-17 MED ORDER — SODIUM CHLORIDE 0.9 % IV SOLN
1.0000 g | Freq: Two times a day (BID) | INTRAVENOUS | Status: DC
  Administered 2020-03-17 – 2020-03-21 (×8): 1 g via INTRAVENOUS
  Filled 2020-03-17 (×10): qty 1

## 2020-03-17 MED ORDER — FENTANYL CITRATE (PF) 100 MCG/2ML IJ SOLN
INTRAMUSCULAR | Status: AC
  Filled 2020-03-17: qty 2

## 2020-03-17 MED ORDER — DEXAMETHASONE SODIUM PHOSPHATE 10 MG/ML IJ SOLN
INTRAMUSCULAR | Status: AC
  Filled 2020-03-17: qty 1

## 2020-03-17 MED ORDER — DEXAMETHASONE SODIUM PHOSPHATE 10 MG/ML IJ SOLN
INTRAMUSCULAR | Status: DC | PRN
  Administered 2020-03-17: 10 mg via INTRAVENOUS

## 2020-03-17 MED ORDER — PHENOL 1.4 % MT LIQD
1.0000 | OROMUCOSAL | Status: DC | PRN
  Administered 2020-03-17: 1 via OROMUCOSAL
  Filled 2020-03-17 (×2): qty 177

## 2020-03-17 MED ORDER — FENTANYL CITRATE (PF) 100 MCG/2ML IJ SOLN
INTRAMUSCULAR | Status: DC | PRN
  Administered 2020-03-17: 50 ug via INTRAVENOUS
  Administered 2020-03-17 (×2): 25 ug via INTRAVENOUS

## 2020-03-17 MED ORDER — ROCURONIUM BROMIDE 10 MG/ML (PF) SYRINGE
PREFILLED_SYRINGE | INTRAVENOUS | Status: AC
  Filled 2020-03-17: qty 10

## 2020-03-17 MED ORDER — LIDOCAINE HCL URETHRAL/MUCOSAL 2 % EX GEL
CUTANEOUS | Status: DC | PRN
Start: 2020-03-17 — End: 2020-03-17
  Administered 2020-03-17: 1 via TOPICAL

## 2020-03-17 MED ORDER — LACTATED RINGERS IV SOLN: INTRAVENOUS | Status: DC | PRN

## 2020-03-17 MED ORDER — PANTOPRAZOLE SODIUM 40 MG IV SOLR
40.0000 mg | INTRAVENOUS | Status: DC
  Filled 2020-03-17: qty 40

## 2020-03-17 MED ORDER — LIDOCAINE HCL URETHRAL/MUCOSAL 2 % EX GEL
CUTANEOUS | Status: AC
  Filled 2020-03-17: qty 5

## 2020-03-17 MED ORDER — SUCCINYLCHOLINE CHLORIDE 20 MG/ML IJ SOLN
INTRAMUSCULAR | Status: DC | PRN
  Administered 2020-03-17: 100 mg via INTRAVENOUS

## 2020-03-17 MED ORDER — SODIUM CHLORIDE 0.9 % IV SOLN
INTRAVENOUS | Status: DC | PRN
  Administered 2020-03-17: 2 g via INTRAVENOUS

## 2020-03-17 MED ORDER — SUCCINYLCHOLINE CHLORIDE 200 MG/10ML IV SOSY
PREFILLED_SYRINGE | INTRAVENOUS | Status: AC
  Filled 2020-03-17: qty 10

## 2020-03-17 MED ORDER — SUGAMMADEX SODIUM 500 MG/5ML IV SOLN
INTRAVENOUS | Status: DC | PRN
  Administered 2020-03-17: 326.4 mg via INTRAVENOUS

## 2020-03-17 MED ORDER — MENTHOL 3 MG MT LOZG
1.0000 | LOZENGE | OROMUCOSAL | Status: DC | PRN
  Administered 2020-03-17: 3 mg via ORAL
  Filled 2020-03-17 (×2): qty 9

## 2020-03-17 MED ORDER — ONDANSETRON HCL 4 MG/2ML IJ SOLN
INTRAMUSCULAR | Status: AC
  Filled 2020-03-17: qty 2

## 2020-03-17 MED ORDER — BUPIVACAINE LIPOSOME 1.3 % IJ SUSP
INTRAMUSCULAR | Status: DC | PRN
  Administered 2020-03-17: 10 mL

## 2020-03-17 MED ORDER — LIDOCAINE HCL (CARDIAC) PF 100 MG/5ML IV SOSY
PREFILLED_SYRINGE | INTRAVENOUS | Status: DC | PRN
  Administered 2020-03-17: 100 mg via INTRAVENOUS

## 2020-03-17 MED ORDER — MIDAZOLAM HCL 2 MG/2ML IJ SOLN
INTRAMUSCULAR | Status: AC
  Filled 2020-03-17: qty 2

## 2020-03-17 MED ORDER — ROCURONIUM BROMIDE 100 MG/10ML IV SOLN
INTRAVENOUS | Status: DC | PRN
  Administered 2020-03-17: 10 mg via INTRAVENOUS
  Administered 2020-03-17: 20 mg via INTRAVENOUS
  Administered 2020-03-17: 60 mg via INTRAVENOUS

## 2020-03-17 MED ORDER — PHENYLEPHRINE HCL (PRESSORS) 10 MG/ML IV SOLN
INTRAVENOUS | Status: AC
  Filled 2020-03-17: qty 1

## 2020-03-17 MED ORDER — GLYCOPYRROLATE 0.2 MG/ML IJ SOLN
INTRAMUSCULAR | Status: AC
  Filled 2020-03-17: qty 1

## 2020-03-17 MED ORDER — EPHEDRINE 5 MG/ML INJ
INTRAVENOUS | Status: AC
  Filled 2020-03-17: qty 10

## 2020-03-17 MED ORDER — MIDAZOLAM HCL 2 MG/2ML IJ SOLN
INTRAMUSCULAR | Status: DC | PRN
  Administered 2020-03-17: 2 mg via INTRAVENOUS

## 2020-03-17 MED ORDER — ONDANSETRON HCL 4 MG/2ML IJ SOLN
INTRAMUSCULAR | Status: DC | PRN
  Administered 2020-03-17: 4 mg via INTRAVENOUS

## 2020-03-17 MED ORDER — SUGAMMADEX SODIUM 500 MG/5ML IV SOLN
INTRAVENOUS | Status: AC
  Filled 2020-03-17: qty 5

## 2020-03-17 MED ORDER — PROPOFOL 10 MG/ML IV BOLUS
INTRAVENOUS | Status: AC
  Filled 2020-03-17: qty 20

## 2020-03-17 MED ORDER — PANTOPRAZOLE SODIUM 40 MG IV SOLR
40.0000 mg | INTRAVENOUS | Status: DC
  Administered 2020-03-17 – 2020-03-20 (×4): 40 mg via INTRAVENOUS
  Filled 2020-03-17 (×4): qty 40

## 2020-03-17 MED ORDER — PROPOFOL 10 MG/ML IV BOLUS
INTRAVENOUS | Status: DC | PRN
  Administered 2020-03-17: 30 mg via INTRAVENOUS
  Administered 2020-03-17: 150 mg via INTRAVENOUS

## 2020-03-17 MED ORDER — KCL IN DEXTROSE-NACL 20-5-0.45 MEQ/L-%-% IV SOLN
INTRAVENOUS | Status: DC
  Filled 2020-03-17 (×10): qty 1000

## 2020-03-17 MED ORDER — BUPIVACAINE-EPINEPHRINE (PF) 0.25% -1:200000 IJ SOLN
INTRAMUSCULAR | Status: DC | PRN
  Administered 2020-03-17: 10 mL via PERINEURAL

## 2020-03-17 SURGICAL SUPPLY — 15 items
CANNULA REDUC XI 12-8 STAPL (CANNULA) ×1
CANNULA REDUCER 12-8 DVNC XI (CANNULA) ×1 IMPLANT
DERMABOND ADVANCED (GAUZE/BANDAGES/DRESSINGS) ×1
DERMABOND ADVANCED .7 DNX12 (GAUZE/BANDAGES/DRESSINGS) ×1 IMPLANT
INFUSOR MANOMETER BAG 3000ML (MISCELLANEOUS) ×2 IMPLANT
IRRIGATOR SUCT 8 DISP DVNC XI (IRRIGATION / IRRIGATOR) ×1 IMPLANT
IRRIGATOR SUCTION 8MM XI DISP (IRRIGATION / IRRIGATOR) ×1
IV NS IRRIG 3000ML ARTHROMATIC (IV SOLUTION) ×2 IMPLANT
SOLUTION ELECTROLUBE (MISCELLANEOUS) ×2 IMPLANT
STAPLER CANNULA SEAL DVNC XI (STAPLE) ×1 IMPLANT
STAPLER CANNULA SEAL XI (STAPLE) ×1
SUT MNCRL AB 4-0 PS2 18 (SUTURE) ×2 IMPLANT
SUT VIC AB 3-0 SH 27 (SUTURE) ×1
SUT VIC AB 3-0 SH 27X BRD (SUTURE) ×1 IMPLANT
SUT VICRYL 0 AB UR-6 (SUTURE) ×4 IMPLANT

## 2020-03-17 NOTE — Progress Notes (Signed)
Subjective: Patient reports some nausea this morning.  Pain remains better controlled on current  analgesic regimen, but still more than anticipated.  Tolerating po: minimal po intake.  Did have a small bowl movement and some flatus following bisacodyl administration   Objective: Vital signs in last 24 hours: Temp:  [98.1 F (36.7 C)-99.3 F (37.4 C)] 98.8 F (37.1 C) (06/20 0737) Pulse Rate:  [66-79] 66 (06/20 0737) Resp:  [17-18] 18 (06/20 0737) BP: (126-147)/(75-88) 135/75 (06/20 0737) SpO2:  [94 %-98 %] 94 % (06/20 0737)    Intake/Output from previous day: 06/19 0701 - 06/20 0700 In: 349.8 [I.V.:349.8] Out: 1900 [Urine:1900]  Physical Examination: General: NAD, well nourished, appears stated age Pulmonary: no increased work of breathing Abdomen: Hypoactive bowl sounds, soft, diffusely tender, mildly distended and tympanic, incision(s) D/C/I Extremities: no edema Neurologic: normal gait   Labs: Results for orders placed or performed during the hospital encounter of 03/14/20 (from the past 72 hour(s))  Surgical pathology     Status: None   Collection Time: 03/14/20 10:11 AM  Result Value Ref Range   SURGICAL PATHOLOGY      SURGICAL PATHOLOGY CASE: [id number] PATIENT: Tennis Ship Surgical Pathology Report     Specimen Submitted: A. Tube, right  Clinical History: Pelvic pain R10.2, hydrosalpinx N70.11     DIAGNOSIS: A.  FALLOPIAN TUBE, RIGHT; SALPINGECTOMY: - CHRONIC HYDROSALPINX. - NO ACTIVE INFLAMMATION OR CHANGES SUGGESTIVE OF ENDOMETRIOSIS.  GROSS DESCRIPTION: A. Labeled: Right tube Received: Formalin Tissue fragment(s): 1 Size: 10.5 cm long and ranges from 0.5 to 1.6 cm in diameter Description: The serosa is purple-tan and smooth with a 0.2 x 0.2 cm defect.  Fimbria are absent.  The lumen is diffusely dilated and ranges from 0.1 to 1.5 cm in diameter.  Additionally, the lumen has a blue tint. The area with the greatest dilation has a thinned  wall, 0.1 cm. No additional masses or lesions are grossly identified. Representative cross-sections are submitted in cassettes 1-2.   Final Diagnosis performed by Bryan Lemma, MD.   Electronically signed 03/15/2020 5:54:29PM T he electronic signature indicates that the named Attending Pathologist has evaluated the specimen Technical component performed at [facility], [address] [place], [place] [address] Lab: [phone] Dir: Rush Farmer, MD, MMM  Professional component performed at [facility], [place], [place], [place] [address] Lab: [phone] Dir: Dellia Nims. Rubinas, MD   CBC     Status: Abnormal   Collection Time: 03/16/20  9:20 AM  Result Value Ref Range   WBC 7.8 4.0 - 10.5 K/uL   RBC 4.26 3.87 - 5.11 MIL/uL   Hemoglobin 11.6 (L) 12.0 - 15.0 g/dL   HCT 34.8 (L) 36 - 46 %   MCV 81.7 80.0 - 100.0 fL   MCH 27.2 26.0 - 34.0 pg   MCHC 33.3 30.0 - 36.0 g/dL   RDW 12.9 11.5 - 15.5 %   Platelets 283 150 - 400 K/uL   nRBC 0.0 0.0 - 0.2 %    Comment: Performed at [facility], [address], [place], [place] [address]  Comprehensive metabolic panel     Status: Abnormal   Collection Time: 03/16/20  9:20 AM  Result Value Ref Range   Sodium 136 135 - 145 mmol/L   Potassium 3.5 3.5 - 5.1 mmol/L   Chloride 100 98 - 111 mmol/L   CO2 24 22 - 32 mmol/L   Glucose, Bld 102 (H) 70 - 99 mg/dL    Comment:  Glucose reference range applies only to samples taken after fasting for at least 8 hours.   BUN 7 6 - 20 mg/dL   Creatinine, Ser 0.84 0.44 - 1.00 mg/dL   Calcium 8.4 (L) 8.9 - 10.3 mg/dL   Total Protein 6.9 6.5 - 8.1 g/dL   Albumin 3.6 3.5 - 5.0 g/dL   AST 22 15 - 41 U/L   ALT 18 0 - 44 U/L   Alkaline Phosphatase 53 38 - 126 U/L   Total Bilirubin 1.4 (H) 0.3 - 1.2 mg/dL   GFR calc non Af Amer >60 >60 mL/min   GFR calc Af Amer >60 >60 mL/min   Anion gap 12 5 - 15    Comment: Performed at [facility], [address]  [place], [place] [address]  CBC     Status: Abnormal   Collection Time: 03/17/20  7:06 AM  Result Value Ref Range   WBC 10.7 (H) 4.0 - 10.5 K/uL   RBC 4.18 3.87 - 5.11 MIL/uL   Hemoglobin 11.2 (L) 12.0 - 15.0 g/dL   HCT 33.7 (L) 36 - 46 %   MCV 80.6 80.0 - 100.0 fL   MCH 26.8 26.0 - 34.0 pg   MCHC 33.2 30.0 - 36.0 g/dL   RDW 12.7 11.5 - 15.5 %   Platelets 277 150 - 400 K/uL   nRBC 0.0 0.0 - 0.2 %    Comment: Performed at [facility], [address], [place], [place] [address]     Assessment:  39 y.o. s/p 3 Days Post-Op Procedure(s) (LRB): OPERATIVE LAPAROSCOPY RIGHT SALPINGECTOMY AND CHROMOPERTUBATION (N/A) CHROMOPERTUBATION (N/A) LAPAROSCOPIC LYSIS OF ADHESIONS : stable  Plan: 1) Pain: still more than anticipated for laparoscopy at this point in recover.   - slight bump in WBC today continue to trend daily - CT A/P normal 03/16/2020 normal - Will obtain KUB today - Did have some flatus but this was following bisacodyl administration  2) Heme: remains hemodynamically stable or afebrile  3) FEN: diet as tolerated.  Lost IV access this morning   4) Prophylaxis: intermittent pneumatic compression boots.  5) Disposition: pending return of bowl function  Malachy Mood, MD, [place], [facility] 03/17/2020, 9:30 AM

## 2020-03-17 NOTE — Progress Notes (Signed)
Paged Md. Pt complains of increasing pain, not improving with medication. Pt rates pain 10/10, nausea, unable to tolerate movement or activity, complains of abdominal spasms. Bowel sounds faint in left upper and lower quadrants, absent to right quadrants. Pt very tender and guarding abdomen. Spoke with Dr. Georgianne Fick, enroute to bedside, will consult with Gen Surgery and see patient momentarily.

## 2020-03-17 NOTE — Anesthesia Procedure Notes (Signed)
Procedure Name: Intubation Date/Time: 03/17/2020 2:23 PM Performed by: Doreen Salvage, CRNA Pre-anesthesia Checklist: Patient identified, Emergency Drugs available, Suction available and Patient being monitored Patient Re-evaluated:Patient Re-evaluated prior to induction Oxygen Delivery Method: Circle system utilized Preoxygenation: Pre-oxygenation with 100% oxygen Induction Type: IV induction, Cricoid Pressure applied and Rapid sequence Ventilation: Mask ventilation without difficulty Laryngoscope Size: Mac, 3 and McGraph Grade View: Grade II Tube type: Oral Tube size: 7.0 mm Number of attempts: 1 Airway Equipment and Method: Stylet Placement Confirmation: ETT inserted through vocal cords under direct vision,  positive ETCO2 and breath sounds checked- equal and bilateral Secured at: 20 cm Tube secured with: Tape Dental Injury: Teeth and Oropharynx as per pre-operative assessment

## 2020-03-17 NOTE — Progress Notes (Signed)
Pt transferred to pre-op. CHG bath and pre-op checklist completed prior to transport. Pt up to BR and voided. Report given to [facility], Therapist, sports. SCDs and TED hose sent with patient. Family notified, husband at bedside.

## 2020-03-17 NOTE — Anesthesia Postprocedure Evaluation (Signed)
Anesthesia Post Note  Patient: Alison Frazier  Procedure(s) Performed: ROBOTIC ASSISTED LAPAROSCOPY, DIAGNOSTIC (N/A Abdomen) SMALL BOWEL REPAIR, laparoscopic robotic assisted. (N/A Abdomen)  Patient location during evaluation: PACU Anesthesia Type: General Level of consciousness: awake and alert and oriented Pain management: pain level controlled Vital Signs Assessment: post-procedure vital signs reviewed and stable Respiratory status: spontaneous breathing Cardiovascular status: blood pressure returned to baseline Anesthetic complications: no   No complications documented.   Last Vitals:  Vitals:   03/17/20 1812 03/17/20 1827  BP: 138/77 140/82  Pulse: 72 75  Resp: 17 19  Temp:  36.6 C  SpO2: 97% 97%    Last Pain:  Vitals:   03/17/20 1939  TempSrc:   PainSc: 5                  Purvis Sidle

## 2020-03-17 NOTE — Transfer of Care (Signed)
Immediate Anesthesia Transfer of Care Note  Patient: Alison Frazier  Procedure(s) Performed: Procedure(s): ROBOTIC ASSISTED LAPAROSCOPY, DIAGNOSTIC (N/A) SMALL BOWEL REPAIR, laparoscopic robotic assisted. (N/A)  Patient Location: PACU  Anesthesia Type:General  Level of Consciousness: sedated  Airway & Oxygen Therapy: Patient Spontanous Breathing and Patient connected to face mask oxygen  Post-op Assessment: Report given to RN and Post -op Vital signs reviewed and stable  Post vital signs: Reviewed and stable  Last Vitals:  Vitals:   03/17/20 1300 03/17/20 1727  BP:  127/72  Pulse: 75 70  Resp: 20 19  Temp: 37.9 C 36.6 C  SpO2: 36% 681%    Complications: No apparent anesthesia complications

## 2020-03-17 NOTE — Op Note (Signed)
Robotic assisted diagnostic laparoscopy with repair of small bowel perforation.  Pre-operative Diagnosis: Postoperative peritonitis with ileus.  Post-operative Diagnosis: same, secondary to small bowel perforation.  Surgeon: Ronny Bacon, M.D., FACS  Assistant: Purcell Mouton, MD.  Anesthesia: General  Findings: Cluster of small bowel in the right lower quadrant, for venous exudates adherent surrounding a multi loop mass.  Some free bile-stained peritoneal fluid.  Small bowel perforation subcentimeter in size.  Adjacent small bowel appears moderately inflamed, viable.  No other appreciable injuries or area of peritoneal inflammatory changes noted.  Estimated Blood Loss: 15 mL         Specimens: None.          Complications: none         Procedure Details  The patient was seen again in the Holding Room. The benefits, complications, treatment options, and expected outcomes were discussed with the patient. The risks of bleeding, infection, recurrence of symptoms, failure to resolve symptoms, unanticipated injury, prosthetic placement, prosthetic infection, any of which could require further surgery were reviewed with the patient. The likelihood of improving the patient's symptoms with return to their baseline status is hopeful.  The patient and/or family concurred with the proposed plan, giving informed consent.  The patient was taken to Operating Room, identified and the procedure verified.    Prior to the induction of general anesthesia, antibiotic prophylaxis was administered. VTE prophylaxis was in place.  General anesthesia was then administered and tolerated well. After the induction, the patient was positioned in the supine position and the abdomen was prepped with Chloraprep and draped in the sterile fashion.  A Time Out was held and the above information confirmed. We opened the suprapubic incision, excising the subcuticular sutures and the fascial stitch.  We digitally probed the  opening into the peritoneum and obtained immediate egress of remarkable volume of fluid.  This was not bile-stained but clear.  I was able to pass an 8.5 mm robotic trocar without any resistance into the peritoneal cavity, and initiated insufflation utilizing carbon dioxide to pressures of 14/15 mmHg. With the laparoscope I was able to identify the clustered small bowel, the coating of fibrinous exudate.  I was able to identify a clear area on the anterior abdominal wall in the left lower quadrant and left flank.  Another 8.5 mm trochar was placed a sufficiently away.   I was able then to look back at the suprapubic area where a lot of the adhesive process was going on, I was then then able to bluntly pushed away the adherent small bowel from this area.  A right lower quadrant accessory port was placed, 8.5 mm trochars placed there under direct visualization.  Subsequently a fourth 8.5 trocar is placed higher in the left abdominal wall. I then resumed care at the console proceeding with mobilization of the small bowel from the adherent/inflammatory process going on in the right lower quadrant.  After judicious blunt dissection utilizing the tips up fenestrated grasper and the fenestrated bipolar grasper we were able to identify a small subcentimeter site on the small intestine that was responsible for the fibrinous/inflammatory reaction that had walled it off well.  I utilized a 3-0 Vicryl to place 3 Lembert sutures to close the defect, and a second layer of 2 Lembert sutures to ensure closure considering the inflammatory nature surrounding this defect.  I felt very confident that this provided an adequate closure, and did not compromise the lumen of the bowel.  These were all placed transverse  to the axis of the peristaltic flow. We took multiple evaluations of the remaining abdomen and there is no other inflammatory process to be seen or appreciated.  I was able to peel a lot of the fibrinous exudate from the  adjacent small bowel to ensure that there is no other area of injury.  Once this was completed I utilized 3 L of irrigation to cleanse the abdominal cavity and pelvis utilizing the robotic suction irrigator.  Once this was completed we undocked the robot and proceeded with closure of the suprapubic and right lower quadrant trocar sites utilizing the PMI and 0 Vicryl completing it under direct visualization.  The remaining 8.5 mm trocar sites in the left side I do not think were large enough to warrant fascial closure. We then cleansed the subcutaneous tissues, then injected the fascial planes of every incision feasible with Exparel.  We then closed the incisions with subcuticulars of 4-0 Monocryl.  We then sealed all the incisions with Dermabond. Overall the patient appeared to tolerate the procedure well.   Ronny Bacon M.D., [facility] 03/17/2020 5:54 PM

## 2020-03-17 NOTE — Anesthesia Preprocedure Evaluation (Signed)
Anesthesia Evaluation  Patient identified by MRN, date of birth, ID band Patient awake    Reviewed: Allergy & Precautions, H&P , NPO status , reviewed documented beta blocker date and time   History of Anesthesia Complications (+) Family history of anesthesia reaction  Airway Mallampati: II  TM Distance: >3 FB Neck ROM: full    Dental  (+) Teeth Intact   Pulmonary asthma ,  Rare inhaler   Pulmonary exam normal        Cardiovascular Normal cardiovascular exam+ dysrhythmias      Neuro/Psych    GI/Hepatic GERD  Controlled,  Endo/Other    Renal/GU      Musculoskeletal   Abdominal   Peds  Hematology   Anesthesia Other Findings Past Medical History: No date: Asthma     Comment:  inhalers in the past No date: Chlamydia No date: Dysrhythmia     Comment:  PVC's No date: Family history of adverse reaction to anesthesia     Comment:  mother died during hip fracture surgery. Discussed at length w pt, reassurance given since pt has personally received GOT anesthesia 3 times without incident.  No date: Family history of colon cancer in father No date: GERD (gastroesophageal reflux disease)     Comment:  not recent No date: Leiomyoma No date: Ovarian cyst Past Surgical History: No date: APPENDECTOMY No date: DILATION AND CURETTAGE OF UTERUS No date: dnc No date: INFERIOR OBLIQUE MYECTOMY No date: OVARIAN CYST REMOVAL   Reproductive/Obstetrics                             Anesthesia Physical  Anesthesia Plan  ASA: II  Anesthesia Plan: General   Post-op Pain Management:    Induction: Intravenous, Cricoid pressure planned and Rapid sequence  PONV Risk Score and Plan: Ondansetron and Treatment may vary due to age or medical condition  Airway Management Planned: Oral ETT  Additional Equipment:   Intra-op Plan:   Post-operative Plan: Extubation in OR  Informed Consent: I have  reviewed the patients History and Physical, chart, labs and discussed the procedure including the risks, benefits and alternatives for the proposed anesthesia with the patient or authorized representative who has indicated his/her understanding and acceptance.     Dental Advisory Given  Plan Discussed with: CRNA  Anesthesia Plan Comments:         Anesthesia Quick Evaluation

## 2020-03-17 NOTE — Consult Note (Signed)
Patient ID: Alison Frazier, female   DOB: 03-17-1981, 39 y.o.   MRN: [id number]  Chief Complaint: Postoperative pain.  History of Present Illness Alison Frazier is a 39 y.o. female with history of laparoscopic salpingo-oophorectomy with lysis of adhesions June 17.  Progressive abdominal discomfort/pain following.  CT scan obtained yesterday which was nondiagnostic.  White blood cell count today trending upward.  Anorexic. Allergy to contrast, significant.  Past Medical History Past Medical History:  Diagnosis Date  . Asthma    inhalers in the past  . Chlamydia   . Dysrhythmia    PVC's  . Family history of adverse reaction to anesthesia    mother died during hip fracture surgery  . Family history of colon cancer in father   . GERD (gastroesophageal reflux disease)    not recent  . Leiomyoma   . Ovarian cyst       Past Surgical History:  Procedure Laterality Date  . APPENDECTOMY    . CHROMOPERTUBATION N/A 03/14/2020   Procedure: CHROMOPERTUBATION;  Surgeon: Gae Dry, MD;  Location: [facility] ORS;  Service: Gynecology;  Laterality: N/A;  . DILATION AND CURETTAGE OF UTERUS    . dnc    . INFERIOR OBLIQUE MYECTOMY    . LAPAROSCOPIC LYSIS OF ADHESIONS  03/14/2020   Procedure: LAPAROSCOPIC LYSIS OF ADHESIONS;  Surgeon: Gae Dry, MD;  Location: [facility] ORS;  Service: Gynecology;;  . LAPAROSCOPIC UNILATERAL SALPINGO OOPHERECTOMY N/A 03/14/2020   Procedure: OPERATIVE LAPAROSCOPY RIGHT SALPINGECTOMY AND CHROMOPERTUBATION;  Surgeon: Gae Dry, MD;  Location: [facility] ORS;  Service: Gynecology;  Laterality: N/A;  . OVARIAN CYST REMOVAL      Allergies  Allergen Reactions  . Contrast Media [Iodinated Diagnostic Agents] Anaphylaxis  . Iodine Anaphylaxis  . Shellfish-Derived Products Anaphylaxis  . Other Other (See Comments)    Pollen  Hives itching  Nasal congestion Plumeria  (flower) causes hives and itching    Current Facility-Administered Medications  Medication Dose Route  Frequency Provider Last Rate Last Admin  . acetaminophen (TYLENOL) tablet 650 mg  650 mg Oral Q4H PRN Gae Dry, MD   650 mg at 03/17/20 0915  . bisacodyl (DULCOLAX) suppository 10 mg  10 mg Rectal Daily PRN Gae Dry, MD   10 mg at 03/17/20 0603  . HYDROmorphone (DILAUDID) tablet 2-4 mg  2-4 mg Oral Q3H PRN Malachy Mood, MD   2 mg at 03/17/20 1129  . ibuprofen (ADVIL) tablet 800 mg  800 mg Oral Q6H Gae Dry, MD   800 mg at 03/17/20 1129  . lactated ringers infusion   Intravenous Continuous Gae Dry, MD   Stopped at 03/15/20 1130  . morphine 2 MG/ML injection 1-2 mg  1-2 mg Intravenous Q3H PRN Gae Dry, MD   2 mg at 03/16/20 1210  . ondansetron (ZOFRAN) tablet 4 mg  4 mg Oral Q6H PRN Gae Dry, MD   4 mg at 03/17/20 0914   Or  . ondansetron (ZOFRAN) injection 4 mg  4 mg Intravenous Q6H PRN Gae Dry, MD   4 mg at 03/15/20 1403  . simethicone (MYLICON) chewable tablet 80 mg  80 mg Oral QID PRN Gae Dry, MD   80 mg at 03/17/20 1218    Family History Family History  Problem Relation Age of Onset  . Stroke Mother   . Diabetes Father   . Hypertension Father   . Colon cancer Father   . Throat cancer Father   .  Breast cancer Neg Hx   . Ovarian cancer Neg Hx       Social History Social History   Tobacco Use  . Smoking status: Never Smoker  . Smokeless tobacco: Never Used  Vaping Use  . Vaping Use: Never used  Substance Use Topics  . Alcohol use: Not Currently  . Drug use: Not Currently        ROS Preoperatively: Constitutional: Negative for chills, fever and malaise/fatigue.  HENT: Negative for congestion, sinus pain and sore throat.   Eyes: Negative for blurred vision and pain.  Respiratory: Negative for cough and wheezing.   Cardiovascular: Negative for chest pain and leg swelling.  Gastrointestinal: Positive for abdominal pain. Negative for constipation, diarrhea, heartburn, nausea and vomiting.   Genitourinary: Negative for dysuria, frequency, hematuria and urgency.  Musculoskeletal: Negative for back pain, joint pain, myalgias and neck pain.  Skin: Negative for itching and rash.  Neurological: Negative for dizziness, tremors and weakness.  Endo/Heme/Allergies: Does not bruise/bleed easily.  Psychiatric/Behavioral: Negative for depression. The patient is not nervous/anxious and does not have insomnia.    Physical Exam Blood pressure 139/82, pulse 71, temperature 98.8 F (37.1 C), temperature source Oral, resp. rate 18, height 5\' 4"  (1.626 m), weight 81.6 kg, last menstrual period 02/26/2020, SpO2 94 %. Last Weight  Most recent update: 03/14/2020  8:41 AM   Weight  81.6 kg (179 lb 14.3 oz)            CONSTITUTIONAL: Well developed, and nourished, appropriately responsive and aware without distress.  EYES: Sclera non-icteric.   EARS, NOSE, MOUTH AND THROAT: Mask worn.  Hearing is intact to voice.  NECK: Trachea is midline, and there is no jugular venous distension.  LYMPH NODES:  Lymph nodes in the neck are not enlarged. RESPIRATORY:  Lungs are clear, and breath sounds are equal bilaterally. Normal respiratory effort without pathologic use of accessory muscles. CARDIOVASCULAR: Heart is regular in rate and rhythm. GI: The abdomen is fairly firm with both involuntary and voluntary guarding, seems most involuntary guarding noticed in the epigastrium. There were no palpable masses, involving any of her surgical incisions.There were rare, if any bowel sounds. SKIN: Skin turgor is normal.   NEUROLOGIC:  Motor and sensation appear grossly normal.  Cranial nerves are grossly without defect. PSYCH:  Alert and oriented to person, place and time. Affect is appropriate for situation. Data Reviewed I have personally reviewed what is currently available of the patient's imaging, recent labs and medical records.   Labs:  CBC Latest Ref Rng & Units 03/17/2020 03/16/2020 03/12/2020  WBC 4.0 - 10.5  K/uL 10.7(H) 7.8 3.7(L)  Hemoglobin 12.0 - 15.0 g/dL 11.2(L) 11.6(L) 12.7  Hematocrit 36 - 46 % 33.7(L) 34.8(L) 37.0  Platelets 150 - 400 K/uL 277 283 338   CMP Latest Ref Rng & Units 03/16/2020  Glucose 70 - 99 mg/dL 102(H)  BUN 6 - 20 mg/dL 7  Creatinine 0.44 - 1.00 mg/dL 0.84  Sodium 135 - 145 mmol/L 136  Potassium 3.5 - 5.1 mmol/L 3.5  Chloride 98 - 111 mmol/L 100  CO2 22 - 32 mmol/L 24  Calcium 8.9 - 10.3 mg/dL 8.4(L)  Total Protein 6.5 - 8.1 g/dL 6.9  Total Bilirubin 0.3 - 1.2 mg/dL 1.4(H)  Alkaline Phos 38 - 126 U/L 53  AST 15 - 41 U/L 22  ALT 0 - 44 U/L 18      Imaging: Radiology review: Images of yesterday's CT scan reviewed.  Today's abdominal x-ray is  also reviewed and concur with the report as noted below. Within last 24 hrs: DG Abd 2 Views  Result Date: 03/17/2020 CLINICAL DATA:  Postoperative ileus EXAM: ABDOMEN - 2 VIEW COMPARISON:  CT 03/16/2020 FINDINGS: Prominent loops of small bowel centrally in the abdomen measuring 2.5 cm. There is gas in the ascending colon and transverse colon. Gas in the rectum. Small amount intraperitoneal free air noted beneath the RIGHT hemidiaphragm related to recent surgery and not changed from comparison CT 1 day prior. IMPRESSION: 1. Persistent small bowel ileus pattern. 2. Small amount of intraperitoneal free air related to recent surgery. Electronically Signed   By: Suzy Bouchard M.D.   On: 03/17/2020 10:53   CLINICAL DATA:  39 year old female with a history of a abdominal pain post laparoscopy/surgery  EXAM: CT ABDOMEN AND PELVIS WITHOUT CONTRAST  TECHNIQUE: Multidetector CT imaging of the abdomen and pelvis was performed following the standard protocol without IV contrast.  COMPARISON:  No prior CT  FINDINGS: Lower chest: Atelectatic changes at the bilateral lung bases  Hepatobiliary: Unremarkable liver.  Unremarkable gallbladder.  Pancreas: Unremarkable  Spleen: Unremarkable  Adrenals/Urinary Tract:  -  Right adrenal gland:  Unremarkable  - Left adrenal gland: Unremarkable.  - Right kidney: No hydronephrosis, nephrolithiasis, inflammation, or ureteral dilation. No focal lesion.  - Left Kidney: No hydronephrosis, nephrolithiasis, inflammation, or ureteral dilation. No focal lesion.  - Urinary Bladder: Urinary bladder mostly decompressed  Stomach/Bowel:  - Stomach: Unremarkable.  - Small bowel: Relatively decompressed. Single loop with air-fluid level, nondistended. Partially fecalized small bowel within the distal small bowel, just proximal to the terminal ileum. There is no discrete transition point identified, although somewhat limited with the absence of IV contrast.  - Appendix: Appendix is not visualized, however, no inflammatory changes are present adjacent to the cecum to indicate an appendicitis.  - Colon: Small fecal stool burden within the colon. No significant distention. Distal colon relatively decompressed.  Vascular/Lymphatic: Unremarkable arterial and venous vasculature. Unremarkable portal vasculature. No adenopathy  Reproductive: Unremarkable uterus. Adnexa not well evaluated status post pelvic surgery. There is low volume of intermediate density fluid layered adjacent to the urinary bladder and in the recto uterine space.  Other: Small volume of free intraperitoneal air in the upper abdomen. Surgical changes of the abdominal wall.  Musculoskeletal: No acute displaced fracture  IMPRESSION: Partially fecalized small bowel in the distal ileum suggest slow motility. A bowel obstruction is not favored at this point, given that there are no dilated loops more proximally. If there is ongoing concern, repeat CT with IV contrast may be considered, for a more sensitive/specific interpretation of the bowel loops.  Early surgical changes of laparoscopic gyn surgery, with expected low volume postsurgical fluid/blood products within the  anatomic pelvis, as well as small volume of intraperitoneal air.   Electronically Signed   By: Corrie Mckusick D.O.   On: 03/16/2020 09:47   Assessment    Unanticipated epigastric/abdominal examination consistent with peritonitis postoperative day 3 following uneventful laparoscopic salpingo-oophorectomy with lysis of adhesions. Differential diagnoses include small bowel obstruction, but would not anticipate tenderness and peritonitis in this scenario. Patient Active Problem List   Diagnosis Date Noted  . S/P laparoscopy 03/14/2020  . Pelvic pain 01/29/2020  . Leiomyoma 11/08/2019  . Hydrosalpinx 11/08/2019    Plan    I discussed diagnostic laparoscopy, robotically assisted in the event that additional intervention is necessary.  Should bowel repair or resection be indicated this was also discussed with patient and her husband.  Risk/benefits of procedure include but are not limited to anesthesia, bleeding, infection, additional injury to intra-abdominal viscera.  Leak from bowel should resection be necessary.  Potential for sepsis, septic shock or even life-threatening deterioration. I believe they understand these risks are not all inclusive, and were reluctant but, now desiring to proceed. I have attempted to answer all their questions to their satisfaction, and no guarantees were ever expressed or implied.  Face-to-face time spent with the patient and accompanying care providers(if present) was 50 minutes, with more than 50% of the time spent counseling, educating, and coordinating care of the patient.      Ronny Bacon M.D., FACS 03/17/2020, 12:57 PM

## 2020-03-18 ENCOUNTER — Encounter: Payer: Self-pay | Admitting: Surgery

## 2020-03-18 LAB — BASIC METABOLIC PANEL
Anion gap: 12 (ref 5–15)
BUN: 8 mg/dL (ref 6–20)
CO2: 27 mmol/L (ref 22–32)
Calcium: 8.2 mg/dL — ABNORMAL LOW (ref 8.9–10.3)
Chloride: 98 mmol/L (ref 98–111)
Creatinine, Ser: 0.87 mg/dL (ref 0.44–1.00)
GFR calc Af Amer: >60 mL/min (ref >60)
GFR calc non Af Amer: >60 mL/min (ref >60)
Glucose, Bld: 155 mg/dL — ABNORMAL HIGH (ref 70–99)
Potassium: 3.7 mmol/L (ref 3.5–5.1)
Sodium: 137 mmol/L (ref 135–145)

## 2020-03-18 LAB — CBC
HCT: 36.7 % (ref 36.0–46.0)
Hemoglobin: 13 g/dL (ref 12.0–15.0)
MCH: 27.3 pg (ref 26.0–34.0)
MCHC: 35.4 g/dL (ref 30.0–36.0)
MCV: 76.9 fL — ABNORMAL LOW (ref 80.0–100.0)
Platelets: 397 10*3/uL (ref 150–400)
RBC: 4.77 MIL/uL (ref 3.87–5.11)
RDW: 12.3 % (ref 11.5–15.5)
WBC: 11.3 10*3/uL — ABNORMAL HIGH (ref 4.0–10.5)
nRBC: 0 % (ref 0.0–0.2)

## 2020-03-18 MED ORDER — PROMETHAZINE HCL 25 MG/ML IJ SOLN: 12.5000 mg | Freq: Four times a day (QID) | INTRAMUSCULAR | Status: DC | PRN

## 2020-03-18 MED ORDER — OXYCODONE-ACETAMINOPHEN 5-325 MG PO TABS
1.0000 | ORAL_TABLET | ORAL | Status: DC | PRN
  Administered 2020-03-18 – 2020-03-22 (×6): 1 via ORAL
  Filled 2020-03-18 (×8): qty 1

## 2020-03-18 MED ORDER — MORPHINE SULFATE (PF) 2 MG/ML IV SOLN
2.0000 mg | INTRAVENOUS | Status: DC | PRN
  Administered 2020-03-18: 2 mg via INTRAVENOUS
  Filled 2020-03-18 (×2): qty 1

## 2020-03-18 MED ORDER — MORPHINE SULFATE (PF) 2 MG/ML IV SOLN
2.0000 mg | INTRAVENOUS | Status: DC | PRN
  Administered 2020-03-18 (×4): 2 mg via INTRAVENOUS
  Administered 2020-03-19 (×2): 4 mg via INTRAVENOUS
  Administered 2020-03-19 – 2020-03-20 (×6): 2 mg via INTRAVENOUS
  Administered 2020-03-20: 4 mg via INTRAVENOUS
  Administered 2020-03-20 – 2020-03-21 (×4): 2 mg via INTRAVENOUS
  Filled 2020-03-18 (×7): qty 1
  Filled 2020-03-18 (×2): qty 2
  Filled 2020-03-18 (×4): qty 1
  Filled 2020-03-18: qty 2
  Filled 2020-03-18 (×3): qty 1

## 2020-03-18 NOTE — Progress Notes (Signed)
Pt has progressed well today. NG tube clamped at 0815 x4 hr per general surgery PA.  Tolerated well, passed gas x3- NG tube removed 1245 per general surgery PA.  No c/o N/V entire shift, just c/o surgical & gas pains. Relieved by PRN meds.  Advanced to clear liquid diet- pt has only had few sips of water & few ice chips. Ambulated to bathroom & bathed with standby assistance.

## 2020-03-18 NOTE — Plan of Care (Signed)
Patient with NG tube to low-intermittent suction; now on 2L/[place] O2 due to sats decreasing to 91%; pain controlled with Morphine IV; patient able to achieve 400 on incentive spirometry with nurse encouraged patient to try to achieve higher and breathe deeper; IV fluids infusing; IV site clear; foley catheter intact and draining clear yellow urine well.

## 2020-03-18 NOTE — Progress Notes (Signed)
[facility] Hospital Day(s): 1.   Post op day(s): 1 Day Post-Op.   Interval History:  Patient seen and examined no acute events or new complaints overnight.  Her biggest complaint is the NGT and requests this be removed She has abdominal soreness, right > left She does endorse nausea but attributes this to the NGT Mild leukocytosis to 11K, no fevers Renal function normal, sCr - 0.87, good UO NGT output recorded at 340 ccs Remained NPO, she did endorse flatus x1 Has not mobilized  Vital signs in last 24 hours: [min-max] current  Temp:  [97.8 F (36.6 C)-100.3 F (37.9 C)] 98.4 F (36.9 C) (06/21 0425) Pulse Rate:  [66-82] 69 (06/21 0425) Resp:  [16-25] 16 (06/21 0425) BP: (127-163)/(72-95) 155/85 (06/21 0425) SpO2:  [91 %-100 %] 95 % (06/21 0450)     Height: 5\' 4"  (162.6 cm) Weight: 81.6 kg BMI (Calculated): 30.86   Intake/Output last 2 shifts:  06/20 0701 - 06/21 0700 In: 2279 [I.V.:1989; NG/GT:90; IV Piggyback:200] Out: 1200 [Urine:850; Emesis/NG output:340; Blood:10]   Physical Exam:  Constitutional: alert, cooperative and no distress  Respiratory: breathing non-labored at rest  Cardiovascular: regular rate and sinus rhythm  Gastrointestinal: Soft, right sided soreness, non-distended, no rebound/guarding Integumentary: Laparoscopic incisions are CDI with dermabond, no erythema or drainage   Labs:  CBC Latest Ref Rng & Units 03/18/2020 03/17/2020 03/16/2020  WBC 4.0 - 10.5 K/uL 11.3(H) 10.7(H) 7.8  Hemoglobin 12.0 - 15.0 g/dL 13.0 11.2(L) 11.6(L)  Hematocrit 36 - 46 % 36.7 33.7(L) 34.8(L)  Platelets 150 - 400 K/uL 397 277 283   CMP Latest Ref Rng & Units 03/18/2020 03/16/2020  Glucose 70 - 99 mg/dL 155(H) 102(H)  BUN 6 - 20 mg/dL 8 7  Creatinine 0.44 - 1.00 mg/dL 0.87 0.84  Sodium 135 - 145 mmol/L 137 136  Potassium 3.5 - 5.1 mmol/L 3.7 3.5  Chloride 98 - 111 mmol/L 98 100  CO2 22 - 32 mmol/L 27 24  Calcium 8.9 - 10.3 mg/dL  8.2(L) 8.4(L)  Total Protein 6.5 - 8.1 g/dL - 6.9  Total Bilirubin 0.3 - 1.2 mg/dL - 1.4(H)  Alkaline Phos 38 - 126 U/L - 53  AST 15 - 41 U/L - 22  ALT 0 - 44 U/L - 18    Imaging studies: No new pertinent imaging studies   Assessment/Plan: 39 y.o. female with expected post-surgical soreness awaiting signficiant return of bowel function 1 Day Post-Op s/p robotic assisted laparoscopic repair of small bowel injury   - Will preform NGT clamping trial this morning, although I am hesitant with removal. Clamp NGT x4 hours, check residuals, if less than 150 cc AND no nausea or vomiting with continued flatus then we will remove NGT. Patient and RN aware.     - Continue IVF resuscitation  - Monitor abdominal examination; on-going bowel function  - Pain control prn; antiemetics prn  - Okay to clamp NGT to mobilize   - Further management per primary service; we will follow    All of the above findings and recommendations were discussed with the patient, and the medical team, and all of patient's questions were answered to her expressed satisfaction.  -- Edison Simon, PA-C [facility] 03/18/2020, 7:36 AM [phone] M-F: 7am - 4pm

## 2020-03-18 NOTE — Progress Notes (Signed)
Daily Benign Gynecology Progress Note Alison Frazier  [id number]  POD#4 from initial surgery, POD#1 from robotic repair of small bowel injury  Subjective:  Overnight Events: no acute events Complaints: pain in her throat, nausea, misery with the NG tube.  She denies: fevers, chills, chest pain, trouble breathing, vomiting, severe abdominal pain.  She has tolerated: ice chips She reports her pain is well controlled with iv pain medication.   She is ambulating and is voiding.  She has passed flatus.   Objective:  Most recent vitals Temp: 99.3 F (37.4 C)  BP: (!) 141/72  Pulse Rate: 70  Resp: 20  SpO2: 96 %   Vitals Range over 24 hours Temp  Avg: 98.6 F (37 C)  Min: 97.8 F (36.6 C)  Max: 99.7 F (37.6 C) BP  Min: 127/72  Max: 166/92 Pulse  Avg: 73.4  Min: 64  Max: 82 SpO2  Avg: 95.8 %  Min: 91 %  Max: 100 %   Physical Exam General: NAD, but appears miserable with NGT present Extremities: no redness or tenderness in the calves or thighs, no edema  AM Labs Lab Results  Component Value Date   WBC 11.3 (H) 03/18/2020   HGB 13.0 03/18/2020   HCT 36.7 03/18/2020   PLT 397 03/18/2020   NA 137 03/18/2020   K 3.7 03/18/2020   CREATININE 0.87 03/18/2020   BUN 8 03/18/2020     Assessment:  Alison Frazier is a 39 y.o. female POD#4 s/p initial salpingectomy with incidental bowel injury repaired by general surgery yesterday.    Plan:  Management per general surgery.  NGT clamped and will be tested for residual soon.   Available to answer questions. Patient had none at this time apart from when she could expect discharge.  Deferred to general surgery.  Prentice Docker, MD  03/18/2020 1:16 PM

## 2020-03-19 DIAGNOSIS — R198 Other specified symptoms and signs involving the digestive system and abdomen: Secondary | ICD-10-CM

## 2020-03-19 MED ORDER — IBUPROFEN 800 MG PO TABS
800.0000 mg | ORAL_TABLET | Freq: Four times a day (QID) | ORAL | Status: DC
  Administered 2020-03-21 – 2020-03-22 (×5): 800 mg via ORAL
  Filled 2020-03-19 (×7): qty 1

## 2020-03-19 MED ORDER — KETOROLAC TROMETHAMINE 30 MG/ML IJ SOLN
15.0000 mg | Freq: Three times a day (TID) | INTRAMUSCULAR | Status: DC | PRN
  Administered 2020-03-20 – 2020-03-21 (×4): 15 mg via INTRAVENOUS
  Filled 2020-03-19 (×5): qty 1

## 2020-03-19 NOTE — Progress Notes (Signed)
Patient ID: Alison Frazier, female   DOB: 08/30/1981, 39 y.o.   MRN: [id number]   Subjective:   She reports her nausea has improved throughout the day. She has walked a few times in the hall. She reports passing flatus. She has tolerated a clear diet. She has been declining oral pain medication because yesterday she vomited when she took the medication on an empty stomach.   Objective:  Blood pressure (!) 160/81, pulse 61, temperature 99.1 F (37.3 C), temperature source Oral, resp. rate 18, height 5\' 4"  (1.626 m), weight 81.6 kg, last menstrual period 02/26/2020, SpO2 98 %.  General: NAD Pulmonary: no increased work of breathing Abdomen: Distended, non-tender, normal bowel sounds Incision: clean, dry, intact Extremities: no edema, no erythema, no tenderness  Results for orders placed or performed during the hospital encounter of 03/14/20 (from the past 72 hour(s))  CBC     Status: Abnormal   Collection Time: 03/17/20  7:06 AM  Result Value Ref Range   WBC 10.7 (H) 4.0 - 10.5 K/uL   RBC 4.18 3.87 - 5.11 MIL/uL   Hemoglobin 11.2 (L) 12.0 - 15.0 g/dL   HCT 33.7 (L) 36 - 46 %   MCV 80.6 80.0 - 100.0 fL   MCH 26.8 26.0 - 34.0 pg   MCHC 33.2 30.0 - 36.0 g/dL   RDW 12.7 11.5 - 15.5 %   Platelets 277 150 - 400 K/uL   nRBC 0.0 0.0 - 0.2 %    Comment: Performed at [facility], [address] [place], [place] [address]  CBC     Status: Abnormal   Collection Time: 03/18/20  5:42 AM  Result Value Ref Range   WBC 11.3 (H) 4.0 - 10.5 K/uL   RBC 4.77 3.87 - 5.11 MIL/uL   Hemoglobin 13.0 12.0 - 15.0 g/dL   HCT 36.7 36 - 46 %   MCV 76.9 (L) 80.0 - 100.0 fL   MCH 27.3 26.0 - 34.0 pg   MCHC 35.4 30.0 - 36.0 g/dL   RDW 12.3 11.5 - 15.5 %   Platelets 397 150 - 400 K/uL   nRBC 0.0 0.0 - 0.2 %    Comment: Performed at [facility], [address] [place], [place] [address]  Basic metabolic panel     Status: Abnormal   Collection Time: 03/18/20  5:42 AM  Result Value Ref  Range   Sodium 137 135 - 145 mmol/L   Potassium 3.7 3.5 - 5.1 mmol/L   Chloride 98 98 - 111 mmol/L   CO2 27 22 - 32 mmol/L   Glucose, Bld 155 (H) 70 - 99 mg/dL    Comment: Glucose reference range applies only to samples taken after fasting for at least 8 hours.   BUN 8 6 - 20 mg/dL   Creatinine, Ser 0.87 0.44 - 1.00 mg/dL   Calcium 8.2 (L) 8.9 - 10.3 mg/dL   GFR calc non Af Amer >60 >60 mL/min   GFR calc Af Amer >60 >60 mL/min   Anion gap 12 5 - 15    Comment: Performed at [facility], [address] [place], [place] [address]     Assessment:   39 y.o. G0P0000 POD # 5 from initial surgery, POD#2 from robotic repair of small bowel injury.    Plan:  1) Awaiting return of bowel function. Progressing well. Continue with clear liquid diet. Encouraged ambulation.   2) Will add IV Toradol . Encouraged patient to minimize IV morphine if  possible to aide with return of bowel function  3) SCDs for DVT prophylaxis  4) Protonix for GI prophylaxis  Adrian Prows MD, [place] 03/19/2020 8:26 PM

## 2020-03-19 NOTE — Plan of Care (Signed)
Vs have been stable with slight elevated BP; MD aware from previous shift; only one episode of emesis this shift and RN notified MD of this; RN has encouraged pt to drink more liquids; RN has seen pt take 2-3 sips of liquids three times this shift; up with assistance to void; RN has gotten pt up every 4 hours to void; pt has declined PO pain meds and only wanted IV morphine a few times this shift; incisions are all WNL; pt also encouraged to ambulate in hallway more (when pain level is tolerable); pt has ambulated to bathroom 3 times with RN; RN did recommend and remind pt that ambulating helps with gas pain; pt declined ambulating in hallway this shift

## 2020-03-19 NOTE — Progress Notes (Addendum)
[facility] Hospital Day(s): 2.   Post op day(s): 2 Days Post-Op.   Interval History:  Patient seen and examined, Passed NGT clamping trial yesterday evening, NGT removed  Patient reports she feels a little better this morning, still with right sided abdominal soreness No fever, chills, nausea, or emesis No new labs or imaging this morning Good UO - 750 ccs On CLD, but only having sips of water/ice chips Ambulating to bathroom only; but she feels she could get up and walk the halls  Vital signs in last 24 hours: [min-max] current  Temp:  [97.8 F (36.6 C)-99.7 F (37.6 C)] 98.5 F (36.9 C) (06/22 0703) Pulse Rate:  [60-70] 60 (06/22 0703) Resp:  [18-20] 18 (06/22 0703) BP: (141-176)/(72-100) 154/84 (06/22 0703) SpO2:  [93 %-99 %] 99 % (06/22 0703)     Height: 5\' 4"  (162.6 cm) Weight: 81.6 kg BMI (Calculated): 30.86   Intake/Output last 2 shifts:  06/21 0701 - 06/22 0700 In: 2381 [I.V.:2092.8; NG/GT:90; IV Piggyback:198.2] Out: 1080 [Urine:750; Emesis/NG output:330]   Physical Exam:  Constitutional: alert, cooperative and no distress  Respiratory: breathing non-labored at rest  Cardiovascular: regular rate and sinus rhythm  Gastrointestinal: Soft, right sided soreness, non-distended, no rebound/guarding Integumentary: Laparoscopic incisions are CDI with dermabond, no erythema or drainage   Labs:  CBC Latest Ref Rng & Units 03/18/2020 03/17/2020 03/16/2020  WBC 4.0 - 10.5 K/uL 11.3(H) 10.7(H) 7.8  Hemoglobin 12.0 - 15.0 g/dL 13.0 11.2(L) 11.6(L)  Hematocrit 36 - 46 % 36.7 33.7(L) 34.8(L)  Platelets 150 - 400 K/uL 397 277 283   CMP Latest Ref Rng & Units 03/18/2020 03/16/2020  Glucose 70 - 99 mg/dL 155(H) 102(H)  BUN 6 - 20 mg/dL 8 7  Creatinine 0.44 - 1.00 mg/dL 0.87 0.84  Sodium 135 - 145 mmol/L 137 136  Potassium 3.5 - 5.1 mmol/L 3.7 3.5  Chloride 98 - 111 mmol/L 98 100  CO2 22 - 32 mmol/L 27 24  Calcium 8.9 - 10.3 mg/dL 8.2(L)  8.4(L)  Total Protein 6.5 - 8.1 g/dL - 6.9  Total Bilirubin 0.3 - 1.2 mg/dL - 1.4(H)  Alkaline Phos 38 - 126 U/L - 53  AST 15 - 41 U/L - 22  ALT 0 - 44 U/L - 18     Imaging studies: No new pertinent imaging studies   Assessment/Plan:  39 y.o. female slowly progressing 2 Days Post-Op s/p robotic assisted laparoscopic repair of small bowel injury   - recommend she stays on CLD for now; if she is feeling better later today/tomorrow with persistent flatus we can advance diet   - Continue IVF resuscitation             - Monitor abdominal examination; on-going bowel function             - Pain control prn; antiemetics prn  - Morning labs  - encouraged mobilization; will not hesitate to engage PT --> she felt that she would be able to ambulate without needing PT             - Further management per primary service; we will follow    All of the above findings and recommendations were discussed with the patient, and the medical team, and all of patient's questions were answered to her expressed satisfaction.  -- Edison Simon, PA-C [facility] 03/19/2020, 7:42 AM [phone] M-F: 7am - 4pm  I saw and evaluated the patient.  I agree with the above  documentation, exam, and plan, which I have edited where appropriate. Fredirick Maudlin  4:11 PM

## 2020-03-20 ENCOUNTER — Inpatient Hospital Stay: Payer: BC Managed Care – PPO

## 2020-03-20 ENCOUNTER — Encounter: Payer: Self-pay | Admitting: Obstetrics & Gynecology

## 2020-03-20 LAB — BASIC METABOLIC PANEL
Anion gap: 8 (ref 5–15)
BUN: 6 mg/dL (ref 6–20)
CO2: 26 mmol/L (ref 22–32)
Calcium: 7.9 mg/dL — ABNORMAL LOW (ref 8.9–10.3)
Chloride: 102 mmol/L (ref 98–111)
Creatinine, Ser: 0.71 mg/dL (ref 0.44–1.00)
GFR calc Af Amer: >60 mL/min (ref >60)
GFR calc non Af Amer: >60 mL/min (ref >60)
Glucose, Bld: 111 mg/dL — ABNORMAL HIGH (ref 70–99)
Potassium: 3.4 mmol/L — ABNORMAL LOW (ref 3.5–5.1)
Sodium: 136 mmol/L (ref 135–145)

## 2020-03-20 LAB — CBC
HCT: 31.4 % — ABNORMAL LOW (ref 36.0–46.0)
Hemoglobin: 10.5 g/dL — ABNORMAL LOW (ref 12.0–15.0)
MCH: 26.9 pg (ref 26.0–34.0)
MCHC: 33.4 g/dL (ref 30.0–36.0)
MCV: 80.3 fL (ref 80.0–100.0)
Platelets: 347 10*3/uL (ref 150–400)
RBC: 3.91 MIL/uL (ref 3.87–5.11)
RDW: 12.5 % (ref 11.5–15.5)
WBC: 6.7 10*3/uL (ref 4.0–10.5)
nRBC: 0 % (ref 0.0–0.2)

## 2020-03-20 MED ORDER — LACTATED RINGERS IV SOLN: INTRAVENOUS | Status: DC

## 2020-03-20 MED ORDER — POTASSIUM CHLORIDE CRYS ER 20 MEQ PO TBCR
20.0000 meq | EXTENDED_RELEASE_TABLET | Freq: Two times a day (BID) | ORAL | Status: DC
  Administered 2020-03-20 – 2020-03-21 (×3): 20 meq via ORAL
  Filled 2020-03-20 (×5): qty 1

## 2020-03-20 NOTE — Progress Notes (Signed)
[facility] Day(s): 3.   Post op day(s): 3 Days Post-Op.   Interval History:  Patient seen and examined no acute events or new complaints overnight.  Patient reports she continues to feel sick this morning, primarily complaining of nausea, belching, gas pains Abdominal soreness the same No emesis No leukocytosis, WBC 6.7K; no fevers Renal function normal; sCr - 0.71; good UO - 2.8L Mild hypokalemia to 3.4 Advanced to regular diet this morning, + flatus, but she does endorse frequent belching Ambulating some  Vital signs in last 24 hours: [min-max] current  Temp:  [98.4 F (36.9 C)-99.1 F (37.3 C)] 98.5 F (36.9 C) (06/23 0756) Pulse Rate:  [56-67] 60 (06/23 0756) Resp:  [18-20] 18 (06/23 0756) BP: (144-174)/(80-86) 152/86 (06/23 0756) SpO2:  [96 %-100 %] 100 % (06/23 0756)     Height: 5\' 4"  (162.6 cm) Weight: 81.6 kg BMI (Calculated): 30.86   Intake/Output last 2 shifts:  06/22 0701 - 06/23 0700 In: 2578.1 [I.V.:2379.8; IV Piggyback:198.4] Out: 2800 [Urine:2800]   Physical Exam:  Constitutional: alert, cooperative and no distress  Respiratory: breathing non-labored at rest  Cardiovascular: regular rate and sinus rhythm  Gastrointestinal:Soft, right sided soreness, somewhat distended, tympanic, no rebound/guarding Integumentary:Laparoscopic incisions are CDI with dermabond, no erythema or drainage  Labs:  CBC Latest Ref Rng & Units 03/20/2020 03/18/2020 03/17/2020  WBC 4.0 - 10.5 K/uL 6.7 11.3(H) 10.7(H)  Hemoglobin 12.0 - 15.0 g/dL 10.5(L) 13.0 11.2(L)  Hematocrit 36 - 46 % 31.4(L) 36.7 33.7(L)  Platelets 150 - 400 K/uL 347 397 277   CMP Latest Ref Rng & Units 03/20/2020 03/18/2020 03/16/2020  Glucose 70 - 99 mg/dL 111(H) 155(H) 102(H)  BUN 6 - 20 mg/dL 6 8 7   Creatinine 0.44 - 1.00 mg/dL 0.71 0.87 0.84  Sodium 135 - 145 mmol/L 136 137 136  Potassium 3.5 - 5.1 mmol/L 3.4(L) 3.7 3.5  Chloride 98 - 111 mmol/L 102 98  100  CO2 22 - 32 mmol/L 26 27 24   Calcium 8.9 - 10.3 mg/dL 7.9(L) 8.2(L) 8.4(L)  Total Protein 6.5 - 8.1 g/dL - - 6.9  Total Bilirubin 0.3 - 1.2 mg/dL - - 1.4(H)  Alkaline Phos 38 - 126 U/L - - 53  AST 15 - 41 U/L - - 22  ALT 0 - 44 U/L - - 18     Imaging studies: No new pertinent imaging studies   Assessment/Plan:  39 y.o. female slow to improve, suspect she is developing ileus, 3 Days Post-Op s/p robotic assisted laparoscopic repair of small bowel injury   - I do suspect she is developing an ileus, recommend continue CLD  - Continue IVF resuscitation  - replete K+; monitor   - I will get a KUB this morning for reassessment although I suspect this will show dilated bowel   - Monitor abdominal examination; on-going bowel function - Pain control prn; antiemetics prn   - Mobilization encouraged; ambulating in hall   - Further management per primary service; we will follow  All of the above findings and recommendations were discussed with the patient, and the medical team, and all of patient's questions were answered to her expressed satisfaction.  -- Edison Simon, PA-C [facility] 03/20/2020, 8:48 AM [phone] M-F: 7am - 4pm

## 2020-03-20 NOTE — Progress Notes (Signed)
Subjective: Patient reports continued abdominal pain.  Pain moderately well-controlled on current  analgesic regimen..  Tolerating po: minimal po intake, is passing flatus, no stools  Objective: Vital signs in last 24 hours: Temp:  [98.5 F (36.9 C)-99.9 F (37.7 C)] 98.6 F (37 C) (06/23 2306) Pulse Rate:  [55-60] 55 (06/23 2306) Resp:  [18-20] 20 (06/23 1943) BP: (152-176)/(76-86) 176/79 (06/23 2306) SpO2:  [94 %-100 %] 96 % (06/23 2306) Last BM Date: 03/17/20  Intake/Output from previous day: 06/22 0701 - 06/23 0700 In: 2578.1 [I.V.:2379.8; IV Piggyback:198.4] Out: 2800 [Urine:2800]  Physical Examination: General: NAD Pulmonary: no increased work of breathing Abdomen: soft, appropriately tender, mildly distended, tympanic to percussion, incision(s) D/C/I Extremities: no edema Neurologic: normal gait  Labs: Results for orders placed or performed during the hospital encounter of 03/14/20 (from the past 72 hour(s))  CBC     Status: Abnormal   Collection Time: 03/18/20  5:42 AM  Result Value Ref Range   WBC 11.3 (H) 4.0 - 10.5 K/uL   RBC 4.77 3.87 - 5.11 MIL/uL   Hemoglobin 13.0 12.0 - 15.0 g/dL   HCT 36.7 36 - 46 %   MCV 76.9 (L) 80.0 - 100.0 fL   MCH 27.3 26.0 - 34.0 pg   MCHC 35.4 30.0 - 36.0 g/dL   RDW 12.3 11.5 - 15.5 %   Platelets 397 150 - 400 K/uL   nRBC 0.0 0.0 - 0.2 %    Comment: Performed at [facility], [address] [place], [place] [address]  Basic metabolic panel     Status: Abnormal   Collection Time: 03/18/20  5:42 AM  Result Value Ref Range   Sodium 137 135 - 145 mmol/L   Potassium 3.7 3.5 - 5.1 mmol/L   Chloride 98 98 - 111 mmol/L   CO2 27 22 - 32 mmol/L   Glucose, Bld 155 (H) 70 - 99 mg/dL    Comment: Glucose reference range applies only to samples taken after fasting for at least 8 hours.   BUN 8 6 - 20 mg/dL   Creatinine, Ser 0.87 0.44 - 1.00 mg/dL   Calcium 8.2 (L) 8.9 - 10.3 mg/dL   GFR calc non Af Amer >60 >60 mL/min    GFR calc Af Amer >60 >60 mL/min   Anion gap 12 5 - 15    Comment: Performed at [facility], [address] [place], [place] [address]  CBC     Status: Abnormal   Collection Time: 03/20/20  4:53 AM  Result Value Ref Range   WBC 6.7 4.0 - 10.5 K/uL   RBC 3.91 3.87 - 5.11 MIL/uL   Hemoglobin 10.5 (L) 12.0 - 15.0 g/dL   HCT 31.4 (L) 36 - 46 %   MCV 80.3 80.0 - 100.0 fL   MCH 26.9 26.0 - 34.0 pg   MCHC 33.4 30.0 - 36.0 g/dL   RDW 12.5 11.5 - 15.5 %   Platelets 347 150 - 400 K/uL   nRBC 0.0 0.0 - 0.2 %    Comment: Performed at [facility], [address] [place], [place] [address]  Basic metabolic panel     Status: Abnormal   Collection Time: 03/20/20  4:53 AM  Result Value Ref Range   Sodium 136 135 - 145 mmol/L   Potassium 3.4 (L) 3.5 - 5.1 mmol/L   Chloride 102 98 - 111 mmol/L   CO2 26 22 - 32 mmol/L   Glucose, Bld 111 (H)  70 - 99 mg/dL    Comment: Glucose reference range applies only to samples taken after fasting for at least 8 hours.   BUN 6 6 - 20 mg/dL   Creatinine, Ser 0.71 0.44 - 1.00 mg/dL   Calcium 7.9 (L) 8.9 - 10.3 mg/dL   GFR calc non Af Amer >60 >60 mL/min   GFR calc Af Amer >60 >60 mL/min   Anion gap 8 5 - 15    Comment: Performed at [facility], [address] [place], [place] [address]     Assessment:  39 y.o. s/p 3 Days Post-Op Procedure(s) (LRB): ROBOTIC ASSISTED LAPAROSCOPY, DIAGNOSTIC (N/A) SMALL BOWEL REPAIR, laparoscopic robotic assisted. (N/A) : stable  Plan: 1) Pain: continue current regimen  2) Heme:hemodynamically stable and afebrile  3) FEN: GI management per general surgery  4)  Disposition: pending return of bowl function  Malachy Mood, MD, [place], [place]

## 2020-03-21 LAB — BASIC METABOLIC PANEL
Anion gap: 9 (ref 5–15)
BUN: 7 mg/dL (ref 6–20)
CO2: 25 mmol/L (ref 22–32)
Calcium: 8.1 mg/dL — ABNORMAL LOW (ref 8.9–10.3)
Chloride: 102 mmol/L (ref 98–111)
Creatinine, Ser: 0.77 mg/dL (ref 0.44–1.00)
GFR calc Af Amer: >60 mL/min (ref >60)
GFR calc non Af Amer: >60 mL/min (ref >60)
Glucose, Bld: 91 mg/dL (ref 70–99)
Potassium: 3.2 mmol/L — ABNORMAL LOW (ref 3.5–5.1)
Sodium: 136 mmol/L (ref 135–145)

## 2020-03-21 MED ORDER — AMOXICILLIN-POT CLAVULANATE 875-125 MG PO TABS
1.0000 | ORAL_TABLET | Freq: Two times a day (BID) | ORAL | Status: DC
  Administered 2020-03-21 – 2020-03-22 (×2): 1 via ORAL
  Filled 2020-03-21 (×3): qty 1

## 2020-03-21 NOTE — Progress Notes (Signed)
Daily Benign Gynecology Progress Note [name]  [id number]  HD#8  Subjective:  Overnight Events: no acute events Complaints: none currently. Has stopped eructating, is passing flatus. She has tolerated some cereal this morning.   She denies: fevers, chills, chest pain, trouble breathing, nausea, vomiting, severe abdominal pain.  She has tolerated: soft diet She reports her pain is well controlled .   She is ambulating and is voiding.  She has passed flatus.   Objective:  Most recent vitals Temp: 98.7 F (37.1 C)  BP: (!) 164/78  Pulse Rate: (!) 57  Resp: 20  SpO2: 100 %   Vitals Range over 24 hours Temp  Avg: 98.9 F (37.2 C)  Min: 98.6 F (37 C)  Max: 99.9 F (37.7 C) BP  Min: 137/72  Max: 176/79 Pulse  Avg: 56.4  Min: 55  Max: 60 SpO2  Avg: 97.6 %  Min: 94 %  Max: 100 %    Physical Exam General: alert, well appearing, and in no distress  AM Labs Lab Results  Component Value Date   WBC 6.7 03/20/2020   HGB 10.5 (L) 03/20/2020   HCT 31.4 (L) 03/20/2020   PLT 347 03/20/2020   NA 136 03/21/2020   K 3.2 (L) 03/21/2020   CREATININE 0.77 03/21/2020   BUN 7 03/21/2020     Assessment:  Alison Frazier is a 39 y.o. female HD#8 s/p operative laparoscopy with right salpingectomy, chromopertubation, lysis of adhesions. POD#4 s/p robot assisted repair of small bowel injury.  Progressing well.  Ileus appears to be resolving.     Plan:  Discharge pending general surgery and confidence in return of bowel function. Activity: as tolerated Anticipated Discharge: likely home tomorrow  Prentice Docker, MD  03/21/2020 10:43 AM

## 2020-03-21 NOTE — Progress Notes (Addendum)
[facility] Hospital Day(s): 4.   Post op day(s): 4 Days Post-Op.   Interval History:  Patient seen and examined no acute events or new complaints overnight.  Patient reports she is feeling much better this morning Only having some gas pains, improved from previous days No nausea, emesis. Belching resolved Mild hypokalemia to 3.2 Renal function is normal, sCr - 0.77, good UO - 2.9L On CLD, tolerating and requesting solid food today + Bowel function, + flatus Mobilizing more  Vital signs in last 24 hours: [min-max] current  Temp:  [98.5 F (36.9 C)-99.9 F (37.7 C)] 98.6 F (37 C) (06/24 0500) Pulse Rate:  [55-60] 55 (06/24 0503) Resp:  [18-20] 20 (06/24 0503) BP: (137-176)/(72-86) 137/72 (06/24 0503) SpO2:  [94 %-100 %] 99 % (06/24 0503)     Height: 5\' 4"  (162.6 cm) Weight: 81.6 kg BMI (Calculated): 30.86   Intake/Output last 2 shifts:  06/23 0701 - 06/24 0700 In: 1289.2 [I.V.:1098.5; IV Piggyback:190.8] Out: 2900 [Urine:2900]   Physical Exam:  Constitutional: alert, cooperative and no distress, appears much improved this morning Respiratory: breathing non-labored at rest  Cardiovascular: regular rate and sinus rhythm  Gastrointestinal:Soft, right sided soreness, somewhat distended, tympanic, no rebound/guarding Integumentary:Laparoscopic incisions are CDI with dermabond, no erythema or drainage   Labs:  CBC Latest Ref Rng & Units 03/20/2020 03/18/2020 03/17/2020  WBC 4.0 - 10.5 K/uL 6.7 11.3(H) 10.7(H)  Hemoglobin 12.0 - 15.0 g/dL 10.5(L) 13.0 11.2(L)  Hematocrit 36 - 46 % 31.4(L) 36.7 33.7(L)  Platelets 150 - 400 K/uL 347 397 277   CMP Latest Ref Rng & Units 03/21/2020 03/20/2020 03/18/2020  Glucose 70 - 99 mg/dL 91 111(H) 155(H)  BUN 6 - 20 mg/dL 7 6 8   Creatinine 0.44 - 1.00 mg/dL 0.77 0.71 0.87  Sodium 135 - 145 mmol/L 136 136 137  Potassium 3.5 - 5.1 mmol/L 3.2(L) 3.4(L) 3.7  Chloride 98 - 111 mmol/L 102 102 98  CO2  22 - 32 mmol/L 25 26 27   Calcium 8.9 - 10.3 mg/dL 8.1(L) 7.9(L) 8.2(L)  Total Protein 6.5 - 8.1 g/dL - - -  Total Bilirubin 0.3 - 1.2 mg/dL - - -  Alkaline Phos 38 - 126 U/L - - -  AST 15 - 41 U/L - - -  ALT 0 - 44 U/L - - -     Imaging studies: No new pertinent imaging studies   Assessment/Plan:  39 y.o. female who appears much improved this morning 4 Days Post-Op s/p robotic assisted laparoscopic repair of small bowel injury   - will advance to soft diet  - discontinue IVF  - should be fine to stop ABx at discharge   - replete K+; monitor  - Monitor abdominal examination; on-going bowel function - Pain control prn (we will only use PO medications today, try to hold IV); antiemetics prn              - Mobilization encouraged; ambulating in hall              - Further management per primary service; we will follow   - Discharge planning; If tolerates soft diet today with continued clinical improvement then we will tentatively plan for discharge tomorrow (06/25) morning    All of the above findings and recommendations were discussed with the patient, and the medical team, and all of patient's questions were answered to her expressed satisfaction.  -- Edison Simon, PA-C [facility] 03/21/2020, 7:30 AM [phone]  M-F: 7am - 4pm

## 2020-03-22 DIAGNOSIS — K567 Ileus, unspecified: Secondary | ICD-10-CM

## 2020-03-22 DIAGNOSIS — Z9889 Other specified postprocedural states: Secondary | ICD-10-CM

## 2020-03-22 MED ORDER — DOCUSATE SODIUM 100 MG PO CAPS: 100.0000 mg | ORAL_CAPSULE | Freq: Two times a day (BID) | ORAL | 1 refills | Status: DC

## 2020-03-22 MED ORDER — DOCUSATE SODIUM 100 MG PO CAPS
100.0000 mg | ORAL_CAPSULE | Freq: Every day | ORAL | Status: DC
  Administered 2020-03-22: 100 mg via ORAL
  Filled 2020-03-22: qty 1

## 2020-03-22 MED ORDER — POLYETHYLENE GLYCOL 3350 17 G PO PACK
17.0000 g | PACK | Freq: Every day | ORAL | Status: DC
  Administered 2020-03-22: 17 g via ORAL
  Filled 2020-03-22: qty 1

## 2020-03-22 MED ORDER — SIMETHICONE 80 MG PO CHEW: 80.0000 mg | CHEWABLE_TABLET | Freq: Four times a day (QID) | ORAL | 0 refills | Status: DC | PRN

## 2020-03-22 MED ORDER — IBUPROFEN 800 MG PO TABS: 800.0000 mg | ORAL_TABLET | Freq: Four times a day (QID) | ORAL | 0 refills | Status: DC

## 2020-03-22 MED ORDER — POLYETHYLENE GLYCOL 3350 17 G PO PACK: 17.0000 g | PACK | Freq: Every day | ORAL | 0 refills | Status: DC

## 2020-03-22 MED ORDER — AMOXICILLIN-POT CLAVULANATE 875-125 MG PO TABS: 1.0000 | ORAL_TABLET | Freq: Two times a day (BID) | ORAL | 0 refills | Status: AC

## 2020-03-22 NOTE — Discharge Summary (Signed)
Physician Discharge Summary  Patient ID: Alison Frazier MRN: [id number] DOB/AGE: 41-Feb-1982 41 y.o.  Admit date: 03/14/2020 Discharge date: 03/22/2020  Admission Diagnoses:  Discharge Diagnoses:  Active Problems:   Hydrosalpinx   Pelvic pain   S/P laparoscopy   Ileus (HCC)   Perforated abdominal viscus   Discharged Condition: good  Hospital Course: Patient was admitted for a laparoscopic right salpingectomy with chromotubation and lysis of adhesion on 03/14/2020.  Postoperatively she became nauseous and had his distended abdomen.  She was taken to the OR for diagnostic laparoscopy and a small bowel perforation was repaired on 03/17/2020.  After her small bowel perforation she had a slow return to bowel function.  She is able to pass gas and tolerated a regular diet.  She was discharged home in stable condition on postoperative day 5 from her's repair of the small bowel perforation.  Consults: general surgery  Significant Diagnostic Studies: labs: see epic  Treatments: IV hydration  Discharge Exam: Blood pressure (!) 156/76, pulse 60, temperature 98.7 F (37.1 C), temperature source Oral, resp. rate 20, height 5\' 4"  (1.626 m), weight 81.6 kg, last menstrual period 02/26/2020, SpO2 96 %. General appearance: alert, cooperative and appears stated age Resp: clear to auscultation bilaterally GI: normal findings: bowel sounds normal and no masses palpable and abnormal findings:  distended Extremities: extremities normal, atraumatic, no cyanosis or edema Skin: Skin color, texture, turgor normal. No rashes or lesions  Disposition: Discharge disposition: 01-Home or Self Care       Discharge Instructions    Call MD for:  difficulty breathing, headache or visual disturbances   Complete by: As directed    Call MD for:  extreme fatigue   Complete by: As directed    Call MD for:  hives   Complete by: As directed    Call MD for:  persistant dizziness or light-headedness   Complete by: As  directed    Call MD for:  persistant nausea and vomiting   Complete by: As directed    Call MD for:  persistant nausea and vomiting   Complete by: As directed    Call MD for:  redness, tenderness, or signs of infection (pain, swelling, redness, odor or green/yellow discharge around incision site)   Complete by: As directed    Call MD for:  redness, tenderness, or signs of infection (pain, swelling, redness, odor or green/yellow discharge around incision site)   Complete by: As directed    Call MD for:  severe uncontrolled pain   Complete by: As directed    Call MD for:  severe uncontrolled pain   Complete by: As directed    Call MD for:  temperature >100.4   Complete by: As directed    Call MD for:  temperature >100.4   Complete by: As directed    Diet general   Complete by: As directed    Soft, bland foods   Discharge instructions   Complete by: As directed    Resume activities according to discharge instruction sheets   Discharge wound care:   Complete by: As directed    Shower Daily   Driving Restrictions   Complete by: As directed    Do not drive while taking narcotic medications   If the dressing is still on your incision site when you go home, remove it on the third day after your surgery date. Remove dressing if it begins to fall off, or if it is dirty or damaged before the third day.  Complete by: As directed    Increase activity slowly   Complete by: As directed    Increase activity slowly   Complete by: As directed    May shower / Bathe   Complete by: As directed      Allergies as of 03/22/2020      Reactions   Contrast Media [iodinated Diagnostic Agents] Anaphylaxis   Iodine Anaphylaxis   Shellfish-derived Products Anaphylaxis   Other Other (See Comments)   Pollen  Hives itching  Nasal congestion Plumeria  (flower) causes hives and itching      Medication List    TAKE these medications   amoxicillin-clavulanate 875-125 MG tablet Commonly known as:  Augmentin Take 1 tablet by mouth 2 (two) times daily for 3 days.   diclofenac 75 MG EC tablet Commonly known as: VOLTAREN Take 1 tablet (75 mg total) by mouth 2 (two) times daily as needed.   docusate sodium 100 MG capsule Commonly known as: COLACE Take 1 capsule (100 mg total) by mouth 2 (two) times daily.   ibuprofen 800 MG tablet Commonly known as: ADVIL Take 1 tablet (800 mg total) by mouth every 6 (six) hours.   meloxicam 7.5 MG tablet Commonly known as: MOBIC Take 7.5 mg by mouth 2 (two) times daily as needed for pain.   oxyCODONE-acetaminophen 5-325 MG tablet Commonly known as: PERCOCET/ROXICET Take 1 tablet by mouth every 4 (four) hours as needed for moderate pain. What changed: reasons to take this   polyethylene glycol 17 g packet Commonly known as: MIRALAX / GLYCOLAX Take 17 g by mouth daily. Start taking on: March 23, 2020   simethicone 80 MG chewable tablet Commonly known as: MYLICON Chew 1 tablet (80 mg total) by mouth 4 (four) times daily as needed for flatulence.            Discharge Care Instructions  (From admission, onward)         Start     Ordered   03/22/20 0000  Discharge wound care:       Comments: Shower Daily   03/22/20 1745   03/14/20 0000  If the dressing is still on your incision site when you go home, remove it on the third day after your surgery date. Remove dressing if it begins to fall off, or if it is dirty or damaged before the third day.        03/14/20 1042          Follow-up Information    Gae Dry, MD. Go in 1 week(s).   Specialty: Obstetrics and Gynecology Why: For Post Op, As Scheduled Contact information: [address] [place] [address] [phone]        Ronny Bacon, MD. Schedule an appointment as soon as possible for a visit in 1 week(s).   Specialty: General Surgery Why: 1 week follow up, robotic laparoscopic enterotomy repair, okay to see Thedore Mins PA if needed Contact information: [address] [place] [address] [phone]               Signed: Homero Fellers 03/22/2020, 5:47 PM

## 2020-03-22 NOTE — Progress Notes (Addendum)
[facility] Hospital Day(s): 5.   Post op day(s): 5 Days Post-Op.   Interval History:  Patient seen and examined no acute events or new complaints overnight.  Patient reports she continues to feel better, abdominal soreness but overall improved and controlled with PO pain regimen No fever, chills, nausea, or emesis No new labs or imaging Patient was advanced to regular diet and IV interventions discontinued + flatus, no burping, awaiting bowel movement Mobilizing well   Vital signs in last 24 hours: [min-max] current  Temp:  [98.1 F (36.7 C)-98.7 F (37.1 C)] 98.1 F (36.7 C) (06/24 2351) Pulse Rate:  [54-66] 66 (06/24 2351) Resp:  [14-20] 14 (06/24 2351) BP: (143-173)/(75-88) 143/88 (06/24 2351) SpO2:  [96 %-100 %] 96 % (06/24 2351)     Height: 5\' 4"  (162.6 cm) Weight: 81.6 kg BMI (Calculated): 30.86   Intake/Output last 2 shifts:  06/24 0701 - 06/25 0700 In: 100 [IV Piggyback:100] Out: 1140 [Urine:1140]   Physical Exam:  Constitutional: alert, cooperative and no distress, sitting in chair Respiratory: breathing non-labored at rest  Cardiovascular: regular rate and sinus rhythm  Gastrointestinal:Soft, right sided soreness, non-distended,no rebound/guarding Integumentary:Laparoscopic incisions are CDI with dermabond, no erythema or drainage   Labs:  CBC Latest Ref Rng & Units 03/20/2020 03/18/2020 03/17/2020  WBC 4.0 - 10.5 K/uL 6.7 11.3(H) 10.7(H)  Hemoglobin 12.0 - 15.0 g/dL 10.5(L) 13.0 11.2(L)  Hematocrit 36 - 46 % 31.4(L) 36.7 33.7(L)  Platelets 150 - 400 K/uL 347 397 277   CMP Latest Ref Rng & Units 03/21/2020 03/20/2020 03/18/2020  Glucose 70 - 99 mg/dL 91 111(H) 155(H)  BUN 6 - 20 mg/dL 7 6 8   Creatinine 0.44 - 1.00 mg/dL 0.77 0.71 0.87  Sodium 135 - 145 mmol/L 136 136 137  Potassium 3.5 - 5.1 mmol/L 3.2(L) 3.4(L) 3.7  Chloride 98 - 111 mmol/L 102 102 98  CO2 22 - 32 mmol/L 25 26 27   Calcium 8.9 - 10.3 mg/dL 8.1(L)  7.9(L) 8.2(L)  Total Protein 6.5 - 8.1 g/dL - - -  Total Bilirubin 0.3 - 1.2 mg/dL - - -  Alkaline Phos 38 - 126 U/L - - -  AST 15 - 41 U/L - - -  ALT 0 - 44 U/L - - -     Imaging studies: No new pertinent imaging studies   Assessment/Plan:  39 y.o. female clinically doing very well, passing flatus, 5 Days Post-Op s/p robotic assisted laparoscopic repair of small bowel injury   - Okay to continue regular diet   - I will start bowel regimen today, she can continue this at home as needed  - Monitor abdominal examination; on-going bowel function - Pain control prn; antiemetics prn  - Mobilization encouraged; ambulating in hall - Further management per primary service; we will follow    - Discharge planning; I do not think we need to wait for bowel movement here as she is clinically improved, passing flatus, tolerating PO, and no longer requiring IV interventions. The patient is agreeable with the above and signs/symptoms to prompt return were reviewed. Okay for discharge from general surgery standpoint, she can follow up in 1-2 weeks with myself or Dr Christian Mate, I will send 3 days of Augmentin for 7 days total of ABx from day of surgery, reviewed general surgery post-op instructions  All of the above findings and recommendations were discussed with the patient, and the medical team, and all of patient's questions were answered to her expressed  satisfaction.  -- Edison Simon, PA-C [facility] 03/22/2020, 7:41 AM [phone] M-F: 7am - 4pm

## 2020-03-22 NOTE — Progress Notes (Signed)
Alison Frazier is a 39 y.o. female patient.  Patient reports that she has felt better throughout the day. She reports that she felt nauseous after eating grits but that she has food sensitivities. She has continued to pass flatus. She denies nausea or vomiting. She has had grits and peaches to eat today. She feels like she would be more comfortable at home and better able to care for herself. She desires discharge home   1. Hydrosalpinx   2. Pelvic pain   3. Ileus, postoperative ([place])   4. Ileus [facility])     Past Medical History:  Diagnosis Date  . Asthma    inhalers in the past  . Chlamydia   . Dysrhythmia    PVC's  . Family history of adverse reaction to anesthesia    mother died during hip fracture surgery  . Family history of colon cancer in father   . GERD (gastroesophageal reflux disease)    not recent  . Leiomyoma   . Ovarian cyst     Current Facility-Administered Medications  Medication Dose Route Frequency Provider Last Rate Last Admin  . acetaminophen (TYLENOL) tablet 650 mg  650 mg Oral Q4H PRN Gae Dry, MD   650 mg at 03/17/20 0915  . amoxicillin-clavulanate (AUGMENTIN) 875-125 MG per tablet 1 tablet  1 tablet Oral Q12H Piscoya, Jose, MD   1 tablet at 03/22/20 1130  . bisacodyl (DULCOLAX) suppository 10 mg  10 mg Rectal Daily PRN Gae Dry, MD   10 mg at 03/17/20 0603  . docusate sodium (COLACE) capsule 100 mg  100 mg Oral Daily Tylene Fantasia, PA-C   100 mg at 03/22/20 1129  . ibuprofen (ADVIL) tablet 800 mg  800 mg Oral Q6H Malachy Mood, MD   800 mg at 03/22/20 1649  . ketorolac (TORADOL) 30 MG/ML injection 15 mg  15 mg Intravenous Q8H PRN Nejla Reasor R, MD   15 mg at 03/21/20 0349  . menthol-cetylpyridinium (CEPACOL) lozenge 3 mg  1 lozenge Oral PRN Malachy Mood, MD   3 mg at 03/17/20 2300  . morphine 2 MG/ML injection 2-4 mg  2-4 mg Intravenous Q2H PRN Tylene Fantasia, PA-C   2 mg at 03/21/20 0405  . ondansetron (ZOFRAN) tablet 4  mg  4 mg Oral Q6H PRN Gae Dry, MD   4 mg at 03/17/20 0914   Or  . ondansetron (ZOFRAN) injection 4 mg  4 mg Intravenous Q6H PRN Gae Dry, MD   4 mg at 03/20/20 1448  . oxyCODONE-acetaminophen (PERCOCET/ROXICET) 5-325 MG per tablet 1-2 tablet  1-2 tablet Oral Q4H PRN Ronny Bacon, MD   1 tablet at 03/22/20 1129  . pantoprazole (PROTONIX) injection 40 mg  40 mg Intravenous Q24H Lu Duffel, RPH   40 mg at 03/20/20 2240  . phenol (CHLORASEPTIC) mouth spray 1 spray  1 spray Mouth/Throat PRN Malachy Mood, MD   1 spray at 03/17/20 2259  . polyethylene glycol (MIRALAX / GLYCOLAX) packet 17 g  17 g Oral Daily Tylene Fantasia, PA-C   17 g at 03/22/20 1130  . potassium chloride SA (KLOR-CON) CR tablet 20 mEq  20 mEq Oral BID Tylene Fantasia, PA-C   20 mEq at 03/21/20 2114  . promethazine (PHENERGAN) injection 12.5 mg  12.5 mg Intravenous Q6H PRN Fredirick Maudlin, MD      . simethicone [facility]) chewable tablet 80 mg  80 mg Oral QID PRN Gae Dry, MD   80 mg  at 03/22/20 1128   Allergies  Allergen Reactions  . Contrast Media [Iodinated Diagnostic Agents] Anaphylaxis  . Iodine Anaphylaxis  . Shellfish-Derived Products Anaphylaxis  . Other Other (See Comments)    Pollen  Hives itching  Nasal congestion Plumeria  (flower) causes hives and itching   Active Problems:   Hydrosalpinx   Pelvic pain   S/P laparoscopy   Ileus ([facility])   Perforated abdominal viscus  Blood pressure (!) 156/76, pulse 60, temperature 98.7 F (37.1 C), temperature source Oral, resp. rate 20, height 5\' 4"  (1.626 m), weight 81.6 kg, last menstrual period 02/26/2020, SpO2 96 %.  Review of Systems  Constitutional: Negative for chills and fever.  HENT: Negative for congestion, hearing loss and sinus pain.   Respiratory: Negative for cough, shortness of breath and wheezing.   Cardiovascular: Negative for chest pain, palpitations and leg swelling.  Gastrointestinal: Negative for abdominal  pain, constipation, diarrhea, nausea and vomiting.  Genitourinary: Negative for dysuria, flank pain, frequency, hematuria and urgency.  Musculoskeletal: Negative for back pain.  Skin: Negative for rash.  Neurological: Negative for dizziness and headaches.  Psychiatric/Behavioral: Negative for suicidal ideas. The patient is not nervous/anxious.     Physical Exam Vitals and nursing note reviewed.  Constitutional:      Appearance: She is well-developed.  HENT:     Head: Normocephalic and atraumatic.  Eyes:     Pupils: Pupils are equal, round, and reactive to light.  Cardiovascular:     Rate and Rhythm: Normal rate and regular rhythm.  Pulmonary:     Effort: Pulmonary effort is normal. No respiratory distress.  Abdominal:     General: There is distension.     Palpations: Abdomen is soft.     Tenderness: There is abdominal tenderness. There is no guarding or rebound.     Comments: Normal bowel sounds Incisions clean dry and intact  Skin:    General: Skin is warm and dry.  Neurological:     Mental Status: She is alert and oriented to person, place, and time.  Psychiatric:        Behavior: Behavior normal.        Thought Content: Thought content normal.        Judgment: Judgment normal.    Plan 39 yo s/p laparoscopy and bowel repair Discussed discharge with patient and her husband. Encouraged patient to consider staying given that her abdomen is distended and she has had minimal food thus far without nausea.  Surgery has not been opposed to her discharge home though given her flatus. She started colace and Miralax today. She will plan to continue these at home as well as her Augmentin. She will need to follow up with surgery next week in office.  Discussed reasons to return to the hospital including nausea, vomiting. Encouraged patient to follow up with general surgery if she does not have a BM by Monday.  Encouraged continued soft and bland foods in small amounts, hydration and  ambulation.   Adrian Prows MD, [facility], [facility] 03/22/2020 5:59 PM

## 2020-03-22 NOTE — Progress Notes (Signed)
Discharge instructions, follow-up appointments and medications reviewed and given to patient. Patient states understanding. Husband to transport home. Escorted out by staff member.

## 2020-03-22 NOTE — Plan of Care (Signed)
Adequate for discharge.

## 2020-03-22 NOTE — Progress Notes (Signed)
Alison Frazier is a 39 y.o. female patient.   Patient seen this AM at 7:45 Reported feeling well. Pain controlled. Denies nausea. + Flatus. No BM. Was able to eat [place] last night for dinner. Reports her admen appears distended to her.  Per nursing at 10 am patient having significant gas pain after eating grits  1. Hydrosalpinx   2. Pelvic pain   3. Ileus, postoperative ([place])   4. Ileus [facility])     Past Medical History:  Diagnosis Date  . Asthma    inhalers in the past  . Chlamydia   . Dysrhythmia    PVC's  . Family history of adverse reaction to anesthesia    mother died during hip fracture surgery  . Family history of colon cancer in father   . GERD (gastroesophageal reflux disease)    not recent  . Leiomyoma   . Ovarian cyst     Current Facility-Administered Medications  Medication Dose Route Frequency Provider Last Rate Last Admin  . acetaminophen (TYLENOL) tablet 650 mg  650 mg Oral Q4H PRN Gae Dry, MD   650 mg at 03/17/20 0915  . amoxicillin-clavulanate (AUGMENTIN) 875-125 MG per tablet 1 tablet  1 tablet Oral Q12H Olean Ree, MD   1 tablet at 03/21/20 2212  . bisacodyl (DULCOLAX) suppository 10 mg  10 mg Rectal Daily PRN Gae Dry, MD   10 mg at 03/17/20 0603  . docusate sodium (COLACE) capsule 100 mg  100 mg Oral Daily Edison Simon R, PA-C      . ibuprofen (ADVIL) tablet 800 mg  800 mg Oral Q6H Malachy Mood, MD   800 mg at 03/22/20 0315  . ketorolac (TORADOL) 30 MG/ML injection 15 mg  15 mg Intravenous Q8H PRN Allanna Bresee R, MD   15 mg at 03/21/20 0349  . menthol-cetylpyridinium (CEPACOL) lozenge 3 mg  1 lozenge Oral PRN Malachy Mood, MD   3 mg at 03/17/20 2300  . morphine 2 MG/ML injection 2-4 mg  2-4 mg Intravenous Q2H PRN Tylene Fantasia, PA-C   2 mg at 03/21/20 0405  . ondansetron (ZOFRAN) tablet 4 mg  4 mg Oral Q6H PRN Gae Dry, MD   4 mg at 03/17/20 0914   Or  . ondansetron (ZOFRAN) injection 4 mg  4 mg Intravenous  Q6H PRN Gae Dry, MD   4 mg at 03/20/20 1448  . oxyCODONE-acetaminophen (PERCOCET/ROXICET) 5-325 MG per tablet 1-2 tablet  1-2 tablet Oral Q4H PRN Ronny Bacon, MD   1 tablet at 03/21/20 2225  . pantoprazole (PROTONIX) injection 40 mg  40 mg Intravenous Q24H Lu Duffel, RPH   40 mg at 03/20/20 2240  . phenol (CHLORASEPTIC) mouth spray 1 spray  1 spray Mouth/Throat PRN Malachy Mood, MD   1 spray at 03/17/20 2259  . polyethylene glycol (MIRALAX / GLYCOLAX) packet 17 g  17 g Oral Daily Edison Simon R, PA-C      . potassium chloride SA (KLOR-CON) CR tablet 20 mEq  20 mEq Oral BID Tylene Fantasia, PA-C   20 mEq at 03/21/20 2114  . promethazine (PHENERGAN) injection 12.5 mg  12.5 mg Intravenous Q6H PRN Fredirick Maudlin, MD      . simethicone [facility]) chewable tablet 80 mg  80 mg Oral QID PRN Gae Dry, MD   80 mg at 03/21/20 2225   Allergies  Allergen Reactions  . Contrast Media [Iodinated Diagnostic Agents] Anaphylaxis  . Iodine Anaphylaxis  . Shellfish-Derived Products  Anaphylaxis  . Other Other (See Comments)    Pollen  Hives itching  Nasal congestion Plumeria  (flower) causes hives and itching   Active Problems:   Hydrosalpinx   Pelvic pain   S/P laparoscopy   Ileus ([facility])   Perforated abdominal viscus  Blood pressure (!) 170/85, pulse (!) 57, temperature 99.3 F (37.4 C), temperature source Oral, resp. rate 18, height 5\' 4"  (1.626 m), weight 81.6 kg, last menstrual period 02/26/2020, SpO2 96 %.  Review of Systems  Constitutional: Negative for chills and fever.  HENT: Negative for congestion, hearing loss and sinus pain.   Respiratory: Negative for cough, shortness of breath and wheezing.   Cardiovascular: Negative for chest pain, palpitations and leg swelling.  Gastrointestinal: Negative for abdominal pain, constipation, diarrhea, nausea and vomiting.  Genitourinary: Negative for dysuria, flank pain, frequency, hematuria and urgency.   Musculoskeletal: Negative for back pain.  Skin: Negative for rash.  Neurological: Negative for dizziness and headaches.  Psychiatric/Behavioral: Negative for suicidal ideas. The patient is not nervous/anxious.     Physical Exam Vitals and nursing note reviewed.  Constitutional:      Appearance: She is well-developed.  HENT:     Head: Normocephalic and atraumatic.  Eyes:     Pupils: Pupils are equal, round, and reactive to light.  Cardiovascular:     Rate and Rhythm: Normal rate and regular rhythm.  Pulmonary:     Effort: Pulmonary effort is normal. No respiratory distress.  Abdominal:     General: There is distension.     Comments: + hyperactive bowel sounds  Skin:    General: Skin is warm and dry.  Neurological:     Mental Status: She is alert and oriented to person, place, and time.  Psychiatric:        Behavior: Behavior normal.        Thought Content: Thought content normal.        Judgment: Judgment normal.     PLAN Will continue to observe the patient.  Per nursing this AM patient was okay for discharge.  Will reevaluate in the PM.   Sparrow Sanzo R Janace Decker 03/22/2020

## 2020-03-23 NOTE — Progress Notes (Signed)
   03/21/20 1210  Clinical Encounter Type  Visited With Patient and family together  Visit Type Initial  Referral From Other (Comment)  Consult/Referral To Chaplain  While rounding staff told chaplain that patient might benefit from a visit. Chaplain knocked on the door and was told to come in. Chaplain asked patient how she was feeling and she said fine. Chaplain told patient that she was just checking on her. She asked if she could pray for her and was told yes. There was a gentleman sitting quietly reading, chaplain asked who he was and he said husband. Chaplain prayed for patient and family, wished her well and left.

## 2020-03-28 ENCOUNTER — Ambulatory Visit (INDEPENDENT_AMBULATORY_CARE_PROVIDER_SITE_OTHER): Payer: Self-pay | Admitting: Physician Assistant

## 2020-03-28 ENCOUNTER — Encounter: Payer: Self-pay | Admitting: Obstetrics & Gynecology

## 2020-03-28 ENCOUNTER — Encounter: Payer: Self-pay | Admitting: Physician Assistant

## 2020-03-28 ENCOUNTER — Ambulatory Visit (INDEPENDENT_AMBULATORY_CARE_PROVIDER_SITE_OTHER): Payer: BC Managed Care – PPO | Admitting: Obstetrics & Gynecology

## 2020-03-28 ENCOUNTER — Other Ambulatory Visit: Payer: Self-pay

## 2020-03-28 VITALS — BP 120/80 | Ht 64.0 in | Wt 175.0 lb

## 2020-03-28 VITALS — BP 144/92 | HR 78 | Temp 97.3°F | Ht 64.0 in | Wt 174.0 lb

## 2020-03-28 DIAGNOSIS — Z09 Encounter for follow-up examination after completed treatment for conditions other than malignant neoplasm: Secondary | ICD-10-CM

## 2020-03-28 DIAGNOSIS — Z9889 Other specified postprocedural states: Secondary | ICD-10-CM

## 2020-03-28 DIAGNOSIS — R198 Other specified symptoms and signs involving the digestive system and abdomen: Secondary | ICD-10-CM

## 2020-03-28 MED ORDER — OXYCODONE-ACETAMINOPHEN 5-325 MG PO TABS: 1.0000 | ORAL_TABLET | ORAL | 0 refills | Status: DC | PRN

## 2020-03-28 MED ORDER — CYCLOBENZAPRINE HCL 5 MG PO TABS: 5.0000 mg | ORAL_TABLET | Freq: Three times a day (TID) | ORAL | 0 refills | Status: DC | PRN

## 2020-03-28 NOTE — Patient Instructions (Addendum)
You may have loose stools for a few weeks yet. You may eat what ever you like.   We will send you in a prescription for a muscle relaxer. Do not drive with this medication until you know how it affects you.

## 2020-03-28 NOTE — Progress Notes (Signed)
[facility] POST-OP OFFICE VISIT  03/28/2020  HPI: Alison Frazier is a 39 y.o. female 11 days s/p robotic assisted laparoscopic repair of small bowel injury with Dr Christian Mate  Patient reports that she is doing better gradually. She still is having somewhat diffuse abdominal soreness, worse with movement all day. She gets some relief with oxycodone and ibuprofen alternating. No fever or chills. Intermittent nausea, spontaneously resolves. No emesis, diarrhea. Tolerating PO and passing flatus. No other complaints.    Vital signs: BP (!) 144/92   Pulse 78   Temp (!) 97.3 F (36.3 C)   Ht 5\' 4"  (1.626 m)   Wt 174 lb (78.9 kg)   LMP 02/12/2020   SpO2 98%   BMI 29.87 kg/m    Physical Exam: Constitutional: Well appearing female, NAD Abdomen: Soft, diffuse soreness, non-distended, no rebound/guarding Skin: Laparoscopic incisions are well healed, dermabond in place, no erythema or drainage   Assessment/Plan: This is a 39 y.o. female 11 days s/p robotic assisted laparoscopic repair of small bowel injury    - continue pain control; I will add Flexeril  - reviewed wound care  - reviewed lifting restrictions  - follow up in 2 weeks for re-check   -- Edison Simon, PA-C [facility] 03/28/2020, 11:21 AM [phone] M-F: 7am - 4pm

## 2020-03-28 NOTE — Progress Notes (Signed)
  Postoperative Follow-up Patient presents post op from Laparoscopy and right Salpingectomy and LOA for a hydrosalpinx and pelvic pain she was having; also had laparoscopic repair of small bowel from that surgery 2 days later for post op pain and ileus noted while in hospital, 1 week ago. Path: DIAGNOSIS:  A. FALLOPIAN TUBE, RIGHT; SALPINGECTOMY:  - CHRONIC HYDROSALPINX.  - NO ACTIVE INFLAMMATION OR CHANGES SUGGESTIVE OF ENDOMETRIOSIS.  Subjective: Patient reports some improvement in her preop symptoms. She still has pain but is improving.  Eating a regular diet without difficulty, also having flatus and BMs. Pain is controlled with current analgesics. Medications being used: ibuprofen (OTC) and narcotic analgesics including Percocet.  Activity: sedentary. Patient reports additional symptom's since surgery of No bleeding or period yet.  Objective: BP 120/80   Ht 5\' 4"  (1.626 m)   Wt 175 lb (79.4 kg)   BMI 30.04 kg/m  Physical Exam Constitutional:      General: She is not in acute distress.    Appearance: She is well-developed.  Cardiovascular:     Rate and Rhythm: Normal rate.  Pulmonary:     Effort: Pulmonary effort is normal.  Abdominal:     General: There is no distension.     Palpations: Abdomen is soft.     Tenderness: There is no abdominal tenderness.     Comments: Incision Healing Well   Musculoskeletal:        General: Normal range of motion.  Neurological:     Mental Status: She is alert and oriented to person, place, and time.     Cranial Nerves: No cranial nerve deficit.  Skin:    General: Skin is warm and dry.     Assessment: s/p :  Laparoscopy, right salpingectomy, also laparoscopy for small intestine repair stable  Plan: Patient has done well after surgery with no apparent worsening or other complications.  I have discussed the post-operative course to date, and the expected progress moving forward.  The patient understands what complications to be concerned  about.  I will see the patient in routine follow up, or sooner if needed.  4 weeks.  Activity plan: Gradually resume normal activities.  Pelvic rest.  OK to drive. Monitor for continued reduction in pain Rx refill Percocet today, counseled on side effects and risks Fertility options discussed  Hoyt Koch 03/28/2020, 2:00 PM

## 2020-04-05 ENCOUNTER — Telehealth: Payer: Self-pay

## 2020-04-05 NOTE — Telephone Encounter (Signed)
Pt calling; had surgery 6/17 and again on 6/20; one of the stitches from the first surgery has come out.  What to do?  [phone]

## 2020-04-05 NOTE — Telephone Encounter (Signed)
2 weeks, so usually expected.  If looks or feels worrisome then come by for a check

## 2020-04-05 NOTE — Telephone Encounter (Signed)
Pt aware.

## 2020-04-09 ENCOUNTER — Ambulatory Visit (INDEPENDENT_AMBULATORY_CARE_PROVIDER_SITE_OTHER): Payer: BC Managed Care – PPO | Admitting: Physician Assistant

## 2020-04-09 ENCOUNTER — Encounter: Payer: Self-pay | Admitting: Physician Assistant

## 2020-04-09 ENCOUNTER — Other Ambulatory Visit: Payer: Self-pay

## 2020-04-09 VITALS — BP 144/90 | HR 74 | Temp 98.6°F | Resp 12 | Ht 64.0 in | Wt 172.6 lb

## 2020-04-09 DIAGNOSIS — R198 Other specified symptoms and signs involving the digestive system and abdomen: Secondary | ICD-10-CM

## 2020-04-09 DIAGNOSIS — Z09 Encounter for follow-up examination after completed treatment for conditions other than malignant neoplasm: Secondary | ICD-10-CM

## 2020-04-09 MED ORDER — TRAMADOL HCL 50 MG PO TABS: 50.0000 mg | ORAL_TABLET | Freq: Four times a day (QID) | ORAL | 0 refills | Status: DC | PRN

## 2020-04-09 MED ORDER — IBUPROFEN 800 MG PO TABS: 800.0000 mg | ORAL_TABLET | Freq: Four times a day (QID) | ORAL | 0 refills | Status: DC

## 2020-04-09 NOTE — Patient Instructions (Addendum)
Pick up your medication at your local pharmacy. Start alternating Ibuprofen and Tylenol every 3 hours and use the Tramadol as a breakthrough. Use ice packs or heat pads to help relieve with pain.   Follow up as needed, call the office if you have any questions or concerns.   GENERAL POST-OPERATIVE PATIENT INSTRUCTIONS   WOUND CARE INSTRUCTIONS:  Keep a dry clean dressing on the wound if there is drainage. The initial bandage may be removed after 24 hours.  Once the wound has quit draining you may leave it open to air.  If clothing rubs against the wound or causes irritation and the wound is not draining you may cover it with a dry dressing during the daytime.  Try to keep the wound dry and avoid ointments on the wound unless directed to do so.  If the wound becomes bright red and painful or starts to drain infected material that is not clear, please contact your physician immediately.  If the wound is mildly pink and has a thick firm ridge underneath it, this is normal, and is referred to as a healing ridge.  This will resolve over the next 4-6 weeks.  BATHING: You may shower if you have been informed of this by your surgeon. However, Please do not submerge in a tub, hot tub, or pool until incisions are completely sealed or have been told by your surgeon that you may do so.  DIET:  You may eat any foods that you can tolerate.  It is a good idea to eat a high fiber diet and take in plenty of fluids to prevent constipation.  If you do become constipated you may want to take a mild laxative or take ducolax tablets on a daily basis until your bowel habits are regular.  Constipation can be very uncomfortable, along with straining, after recent surgery.  ACTIVITY:  You are encouraged to cough and deep breath or use your incentive spirometer if you were given one, every 15-30 minutes when awake.  This will help prevent respiratory complications and low grade fevers post-operatively if you had a general  anesthetic.  You may want to hug a pillow when coughing and sneezing to add additional support to the surgical area, if you had abdominal or chest surgery, which will decrease pain during these times.  You are encouraged to walk and engage in light activity for the next two weeks.  You should not lift more than 20 pounds, until 04/28/20 as it could put you at increased risk for complications.  Twenty pounds is roughly equivalent to a plastic bag of groceries. At that time- Listen to your body when lifting, if you have pain when lifting, stop and then try again in a few days. Soreness after doing exercises or activities of daily living is normal as you get back in to your normal routine.  MEDICATIONS:  Try to take narcotic medications and anti-inflammatory medications, such as tylenol, ibuprofen, naprosyn, etc., with food.  This will minimize stomach upset from the medication.  Should you develop nausea and vomiting from the pain medication, or develop a rash, please discontinue the medication and contact your physician.  You should not drive, make important decisions, or operate machinery when taking narcotic pain medication.  SUNBLOCK Use sun block to incision area over the next year if this area will be exposed to sun. This helps decrease scarring and will allow you avoid a permanent darkened area over your incision.  QUESTIONS:  Please feel free to  call our office if you have any questions, and we will be glad to assist you.

## 2020-04-09 NOTE — Progress Notes (Signed)
[facility] POST-OP OFFICE VISIT  04/09/2020  HPI: Alison Frazier is a 39 y.o. female 23 days s/p robotic assisted laparoscopic repair of small bowel injury with Dr Christian Mate  Overall doing well She has some residual incisional soreness, waxes and wanes in intensity. She only has ibuprofen left which is not working as well. She does not want any more oxycodone and stopped the flexeril secondary to worsening of her muscle spasm.  No fever, chills, nausea, emesis Tolerating PO  Normal bowel function  Vital signs: BP (!) 144/90   Pulse 74   Temp 98.6 F (37 C)   Resp 12   Ht 5\' 4"  (1.626 m)   Wt 172 lb 9.6 oz (78.3 kg)   SpO2 98%   BMI 29.63 kg/m    Physical Exam: Constitutional: Well appearing female, NAD Abdomen: Soft, incisional soreness primarily at RLQ and umbilical incisions, non-distended, no rebound/guarding Skin: laparoscopic incisions are well healed, no erythema or drainage  Assessment/Plan: This is a 39 y.o. female 23 days s/p robotic assisted laparoscopic repair of small bowel injury   - I will refill her 800 mg Ibuprofen and try to add Tramadol prn for pain control  - Reviewed lifting restrictions  - reviewed wound care instructions  - She will follow up with OB/GYN later this month  - Rtc prn; advised to call with questions/concerns. I did discuss return precautions with her and her husband and they were understanding of these.   -- Edison Simon, PA-C [facility] 04/09/2020, 11:03 AM [phone] M-F: 7am - 4pm

## 2020-04-24 ENCOUNTER — Other Ambulatory Visit: Payer: Self-pay

## 2020-04-24 ENCOUNTER — Ambulatory Visit (INDEPENDENT_AMBULATORY_CARE_PROVIDER_SITE_OTHER): Payer: BC Managed Care – PPO | Admitting: Obstetrics & Gynecology

## 2020-04-24 ENCOUNTER — Encounter: Payer: Self-pay | Admitting: Obstetrics & Gynecology

## 2020-04-24 VITALS — BP 120/80 | Ht 64.0 in | Wt 174.0 lb

## 2020-04-24 DIAGNOSIS — R102 Pelvic and perineal pain: Secondary | ICD-10-CM

## 2020-04-24 DIAGNOSIS — N7011 Chronic salpingitis: Secondary | ICD-10-CM

## 2020-04-24 DIAGNOSIS — Z9889 Other specified postprocedural states: Secondary | ICD-10-CM

## 2020-04-24 NOTE — Progress Notes (Signed)
  Postoperative Follow-up Patient presents post op from right salpingectomy and then second lap for bowel lac repair for pelvic pain, 1 month ago.  Subjective: Patient reports some improvement in her preop symptoms. Eating a regular diet without difficulty. Pain is controlled with current analgesics. Medications being used: ibuprofen (OTC).  Activity: normal activities of daily living. Patient reports additional symptom's since surgery of No GI or GU side effects.  Cycle now, normal so far.  Objective: BP 120/80   Ht 5\' 4"  (1.626 m)   Wt 174 lb (78.9 kg)   BMI 29.87 kg/m  Physical Exam Constitutional:      General: She is not in acute distress.    Appearance: She is well-developed.  Cardiovascular:     Rate and Rhythm: Normal rate.  Pulmonary:     Effort: Pulmonary effort is normal.  Abdominal:     General: There is no distension.     Palpations: Abdomen is soft.     Tenderness: There is no abdominal tenderness.     Comments: Incision Healing Well   Musculoskeletal:        General: Normal range of motion.  Neurological:     Mental Status: She is alert and oriented to person, place, and time.     Cranial Nerves: No cranial nerve deficit.  Skin:    General: Skin is warm and dry.     Assessment: s/p :  Lap salpingectomy progressing well  Plan: Patient has done well after surgery.  I have discussed the post-operative course to date, and the expected progress moving forward.  The patient understands what complications to be concerned about.  I will see the patient in routine follow up, or sooner if needed.    Activity plan: No restriction.  No BC desired, also does not desire fertility treatments.  [place].  Monitor for cycle regularity and dysmenorrhea, hope to see steady improvements.  Hoyt Koch 04/24/2020, 11:34 AM

## 2020-05-08 DIAGNOSIS — I5189 Other ill-defined heart diseases: Secondary | ICD-10-CM

## 2020-05-08 HISTORY — DX: Other ill-defined heart diseases: I51.89

## 2020-05-15 ENCOUNTER — Other Ambulatory Visit: Payer: Self-pay | Admitting: Obstetrics and Gynecology

## 2020-05-15 ENCOUNTER — Telehealth: Payer: Self-pay

## 2020-05-15 MED ORDER — FLUCONAZOLE 150 MG PO TABS: 150.0000 mg | ORAL_TABLET | Freq: Once | ORAL | 0 refills | Status: AC

## 2020-05-15 NOTE — Telephone Encounter (Signed)
Spoke w/patient. She reports this is her 3rd YI since June. She has extreme irritation/swelling/discomfort when wiping. D/C started last night. OTC Monistat burns. She had changed her detergent which may be the issue. Advised not to use dryer sheets, She is going back to just soap and water (no perfumed products).

## 2020-05-15 NOTE — Progress Notes (Signed)
Rx diflucan for yeast vag 

## 2020-05-15 NOTE — Telephone Encounter (Signed)
Patient reports having frequent yeast infections since she has been hospitalized. She has tried OTC meds and they are too strong/painful for her. Requesting Diflucan. [place]#[phone]

## 2020-05-15 NOTE — Telephone Encounter (Signed)
Patient aware.

## 2020-05-15 NOTE — Telephone Encounter (Signed)
Rx diflucan eRxd. Pls notify pt. Thx

## 2020-08-01 ENCOUNTER — Other Ambulatory Visit
Admission: RE | Admit: 2020-08-01 | Discharge: 2020-08-01 | Disposition: A | Discharge status: Home / Self Care | Payer: BC Managed Care – PPO | Source: Ambulatory Visit | Attending: Student | Admitting: Student

## 2020-08-01 DIAGNOSIS — R0600 Dyspnea, unspecified: Secondary | ICD-10-CM | POA: Diagnosis not present

## 2020-08-01 LAB — FIBRIN DERIVATIVES D-DIMER (ARMC ONLY): Fibrin derivatives D-dimer (ARMC): 236.57 ng/mL (FEU) (ref 0.00–499.00)

## 2020-11-01 ENCOUNTER — Other Ambulatory Visit: Payer: Self-pay | Admitting: Sports Medicine

## 2020-11-01 DIAGNOSIS — G8929 Other chronic pain: Secondary | ICD-10-CM

## 2020-11-01 DIAGNOSIS — M25662 Stiffness of left knee, not elsewhere classified: Secondary | ICD-10-CM

## 2020-11-01 DIAGNOSIS — M2392 Unspecified internal derangement of left knee: Secondary | ICD-10-CM

## 2020-11-01 DIAGNOSIS — M25862 Other specified joint disorders, left knee: Secondary | ICD-10-CM

## 2020-11-10 ENCOUNTER — Ambulatory Visit
Admission: RE | Admit: 2020-11-10 | Discharge: 2020-11-10 | Disposition: A | Discharge status: Home / Self Care | Payer: BC Managed Care – PPO | Source: Ambulatory Visit | Attending: Sports Medicine | Admitting: Sports Medicine

## 2020-11-10 ENCOUNTER — Other Ambulatory Visit: Payer: Self-pay

## 2020-11-10 DIAGNOSIS — M25862 Other specified joint disorders, left knee: Secondary | ICD-10-CM | POA: Insufficient documentation

## 2020-11-10 DIAGNOSIS — M25662 Stiffness of left knee, not elsewhere classified: Secondary | ICD-10-CM | POA: Diagnosis present

## 2020-11-10 DIAGNOSIS — M25562 Pain in left knee: Secondary | ICD-10-CM | POA: Diagnosis present

## 2020-11-10 DIAGNOSIS — M2392 Unspecified internal derangement of left knee: Secondary | ICD-10-CM

## 2020-11-10 DIAGNOSIS — G8929 Other chronic pain: Secondary | ICD-10-CM | POA: Diagnosis present

## 2020-12-17 ENCOUNTER — Ambulatory Visit (INDEPENDENT_AMBULATORY_CARE_PROVIDER_SITE_OTHER): Payer: BC Managed Care – PPO

## 2020-12-17 ENCOUNTER — Encounter: Payer: Self-pay | Admitting: Podiatry

## 2020-12-17 ENCOUNTER — Other Ambulatory Visit: Payer: Self-pay

## 2020-12-17 ENCOUNTER — Ambulatory Visit: Payer: BC Managed Care – PPO | Admitting: Podiatry

## 2020-12-17 DIAGNOSIS — M722 Plantar fascial fibromatosis: Secondary | ICD-10-CM

## 2020-12-18 NOTE — Progress Notes (Signed)
Subjective:  Patient ID: Alison Frazier, female    DOB: [birth year],  MRN: [id number]  Chief Complaint  Patient presents with  . Foot Pain    Patient presents today for left heel pain x 2 years, recently has become much worse.      40 y.o. female presents with the above complaint.  Patient presents with complaint left heel pain that has been there for past 2 years has progressive gotten worse.  Patient states is painful to walk on it it is pain when standing from sitting position.  It is a deep ache.  She is try Tylenol ibuprofen none of which has helped.  She would like to discuss treatment options for this.  She has not seen anyone else prior to seeing me.  She denies any other acute complaints.   Review of Systems: Negative except as noted in the HPI. Denies N/V/F/Ch.  Past Medical History:  Diagnosis Date  . Asthma    inhalers in the past  . Chlamydia   . Dysrhythmia    PVC's  . Family history of adverse reaction to anesthesia    mother died during hip fracture surgery  . Family history of colon cancer in father   . GERD (gastroesophageal reflux disease)    not recent  . Leiomyoma   . Ovarian cyst     Current Outpatient Medications:  .  albuterol (VENTOLIN HFA) 108 (90 Base) MCG/ACT inhaler, INHALE 2 INHALATIONS INTO THE LUNGS EVERY 6 HOURS AS NEEDED FOR WHEEZING, Disp: , Rfl:  .  carvedilol (COREG) 3.125 MG tablet, TAKE 1 TABLET(3.125 MG) BY MOUTH TWICE DAILY WITH MEALS, Disp: , Rfl:  .  losartan (COZAAR) 25 MG tablet, Take 1 tablet by mouth daily., Disp: , Rfl:  .  montelukast (SINGULAIR) 10 MG tablet, Take by mouth., Disp: , Rfl:  .  ibuprofen (ADVIL) 800 MG tablet, Take 1 tablet (800 mg total) by mouth every 6 (six) hours., Disp: 30 tablet, Rfl: 0  Social History   Tobacco Use  Smoking Status Never Smoker  Smokeless Tobacco Never Used    Allergies  Allergen Reactions  . Contrast Media [Iodinated Diagnostic Agents] Anaphylaxis  . Iodine Anaphylaxis  .  Shellfish-Derived Products Anaphylaxis  . Other Other (See Comments)    Pollen  Hives itching  Nasal congestion Plumeria  (flower) causes hives and itching   Objective:  There were no vitals filed for this visit. There is no height or weight on file to calculate BMI. Constitutional Well developed. Well nourished.  Vascular Dorsalis pedis pulses palpable bilaterally. Posterior tibial pulses palpable bilaterally. Capillary refill normal to all digits.  No cyanosis or clubbing noted. Pedal hair growth normal.  Neurologic Normal speech. Oriented to person, place, and time. Epicritic sensation to light touch grossly present bilaterally.  Dermatologic Nails well groomed and normal in appearance. No open wounds. No skin lesions.  Orthopedic: Normal joint ROM without pain or crepitus bilaterally. No visible deformities. Tender to palpation at the calcaneal tuber left. No pain with calcaneal squeeze left. Ankle ROM diminished range of motion left. Silfverskiold Test: positive left.   Radiographs: Taken and reviewed. No acute fractures or dislocations. No evidence of stress fracture.  Plantar heel spur present. Posterior heel spur absent.   Assessment:   1. Plantar fasciitis of left foot    Plan:  Patient was evaluated and treated and all questions answered.  Plantar Fasciitis, left - XR reviewed as above.  - Educated on icing and stretching. Instructions given.  -  Injection delivered to the plantar fascia as below. - DME: Plantar Fascial Brace - Pharmacologic management: None  Procedure: Injection Tendon/Ligament Location: Left plantar fascia at the glabrous junction; medial approach. Skin Prep: alcohol Injectate: 0.5 cc 0.5% marcaine plain, 0.5 cc of 1% Lidocaine, 0.5 cc kenalog 10. Disposition: Patient tolerated procedure well. Injection site dressed with a band-aid.  No follow-ups on file.

## 2020-12-31 ENCOUNTER — Ambulatory Visit: Payer: Self-pay | Admitting: Obstetrics and Gynecology

## 2021-01-14 ENCOUNTER — Ambulatory Visit: Payer: BC Managed Care – PPO | Admitting: Podiatry

## 2021-02-03 NOTE — Progress Notes (Signed)
PCP:  Virginia Crews, MD   Chief Complaint  Patient presents with  . Gynecologic Exam    No concerns     HPI:      Alison Frazier is a 40 y.o. No obstetric history on file. who LMP was Patient's last menstrual period was 01/27/2021 (exact date)., presents today for her annual examination.  Her menses are regular every 28-30 days, lasting 7 days.  Dysmenorrhea mild to mod, improved with NSAIDs. She does not have intermenstrual bleeding. S/p RT salpingectomy due to hydrosalpinx 6/21. Also with hx of ovar cysts and 2 cm leio per 2/21 u/s results.   Sex activity: single partner, contraception - none.  Last Pap: 11/08/19 Results were no abnormalities/POS HPV DNA; repeat pap due today; no hx of abn with tx in past Hx of STDs: chlamydia in distant past; HPV 2021  Last mammogram: never There is no FH of breast cancer. There is no FH of ovarian cancer. The patient does do self-breast exams.  Tobacco use: The patient denies current or previous tobacco use. Alcohol use: none No drug use.  Exercise: not active  She does get adequate calcium and Vitamin D in her diet. No recent labs--will do with PCP (insurance requires this)  Past Medical History:  Diagnosis Date  . Asthma    inhalers in the past  . Chlamydia   . Dysrhythmia    PVC's  . Family history of adverse reaction to anesthesia    mother died during hip fracture surgery  . Family history of colon cancer in father   . GERD (gastroesophageal reflux disease)    not recent  . Leiomyoma   . Ovarian cyst      Past Surgical History:  Procedure Laterality Date  . APPENDECTOMY    . CHROMOPERTUBATION N/A 03/14/2020   Procedure: CHROMOPERTUBATION;  Surgeon: Gae Dry, MD;  Location: [facility];  Service: Gynecology;  Laterality: N/A;  . DILATION AND CURETTAGE OF UTERUS    . dnc    . INFERIOR OBLIQUE MYECTOMY    . LAPAROSCOPIC LYSIS OF ADHESIONS  03/14/2020   Procedure: LAPAROSCOPIC LYSIS OF ADHESIONS;  Surgeon:  Gae Dry, MD;  Location: [facility];  Service: Gynecology;;  . LAPAROSCOPIC UNILATERAL SALPINGO OOPHERECTOMY N/A 03/14/2020   Procedure: OPERATIVE LAPAROSCOPY RIGHT SALPINGECTOMY AND CHROMOPERTUBATION;  Surgeon: Gae Dry, MD;  Location: [facility];  Service: Gynecology;  Laterality: N/A;  . LAPAROSCOPY N/A 03/17/2020   Procedure: ROBOTIC ASSISTED LAPAROSCOPY, DIAGNOSTIC;  Surgeon: Ronny Bacon, MD;  Location: [facility];  Service: General;  Laterality: N/A;  . OVARIAN CYST REMOVAL    . SMALL BOWEL REPAIR N/A 03/17/2020   Procedure: SMALL BOWEL REPAIR, laparoscopic robotic assisted.;  Surgeon: Ronny Bacon, MD;  Location: [facility];  Service: General;  Laterality: N/A;    Family History  Problem Relation Age of Onset  . Stroke Mother   . Diabetes Father   . Hypertension Father   . Colon cancer Father   . Throat cancer Father   . Breast cancer Neg Hx   . Ovarian cancer Neg Hx     Social History   Socioeconomic History  . Marital status: Married    Spouse name: Not on file  . Number of children: Not on file  . Years of education: Not on file  . Highest education level: Not on file  Occupational History  . Not on file  Tobacco Use  . Smoking status: Never Smoker  . Smokeless tobacco: Never  Used  Vaping Use  . Vaping Use: Never used  Substance and Sexual Activity  . Alcohol use: Not Currently  . Drug use: Not Currently  . Sexual activity: Yes    Birth control/protection: None  Other Topics Concern  . Not on file  Social History Narrative  . Not on file   Social Determinants of Health   Financial Resource Strain: Not on file  Food Insecurity: Not on file  Transportation Needs: Not on file  Physical Activity: Not on file  Stress: Not on file  Social Connections: Not on file  Intimate Partner Violence: Not on file     Current Outpatient Medications:  .  albuterol (VENTOLIN HFA) 108 (90 Base) MCG/ACT inhaler, INHALE 2 INHALATIONS INTO THE LUNGS EVERY 6  HOURS AS NEEDED FOR WHEEZING, Disp: , Rfl:  .  carvedilol (COREG) 3.125 MG tablet, TAKE 1 TABLET(3.125 MG) BY MOUTH TWICE DAILY WITH MEALS, Disp: , Rfl:  .  ibuprofen (ADVIL) 800 MG tablet, Take 1 tablet (800 mg total) by mouth every 6 (six) hours., Disp: 30 tablet, Rfl: 0 .  losartan (COZAAR) 25 MG tablet, Take 1 tablet by mouth daily., Disp: , Rfl:  .  montelukast (SINGULAIR) 10 MG tablet, Take by mouth., Disp: , Rfl:      ROS:  Review of Systems  Constitutional: Negative for fatigue, fever and unexpected weight change.  Respiratory: Negative for cough, shortness of breath and wheezing.   Cardiovascular: Negative for chest pain, palpitations and leg swelling.  Gastrointestinal: Negative for blood in stool, constipation, diarrhea, nausea and vomiting.  Endocrine: Negative for cold intolerance, heat intolerance and polyuria.  Genitourinary: Negative for dyspareunia, dysuria, flank pain, frequency, genital sores, hematuria, menstrual problem, pelvic pain, urgency, vaginal bleeding, vaginal discharge and vaginal pain.  Musculoskeletal: Negative for back pain, joint swelling and myalgias.  Skin: Negative for rash.  Neurological: Negative for dizziness, syncope, light-headedness, numbness and headaches.  Hematological: Negative for adenopathy.  Psychiatric/Behavioral: Negative for agitation, confusion, sleep disturbance and suicidal ideas. The patient is not nervous/anxious.   BREAST: No symptoms   Objective: BP 130/80   Ht 5\' 4"  (1.626 m)   Wt 187 lb (84.8 kg)   LMP 01/27/2021 (Exact Date)   BMI 32.10 kg/m    Physical Exam Constitutional:      Appearance: She is well-developed.  Genitourinary:     Vulva normal.     Right Labia: No rash, tenderness or lesions.    Left Labia: No tenderness, lesions or rash.    No vaginal discharge, erythema or tenderness.      Right Adnexa: not tender and no mass present.    Left Adnexa: not tender and no mass present.    No cervical  friability or polyp.     Uterus is not enlarged or tender.  Breasts:     Right: No mass, nipple discharge, skin change or tenderness.     Left: No mass, nipple discharge, skin change or tenderness.    Neck:     Thyroid: No thyromegaly.  Cardiovascular:     Rate and Rhythm: Normal rate and regular rhythm.     Heart sounds: Normal heart sounds. No murmur heard.   Pulmonary:     Effort: Pulmonary effort is normal.     Breath sounds: Normal breath sounds.  Abdominal:     Palpations: Abdomen is soft.     Tenderness: There is no abdominal tenderness. There is no guarding or rebound.  Musculoskeletal:  General: Normal range of motion.     Cervical back: Normal range of motion.  Lymphadenopathy:     Cervical: No cervical adenopathy.  Neurological:     General: No focal deficit present.     Mental Status: She is alert and oriented to person, place, and time.     Cranial Nerves: No cranial nerve deficit.  Skin:    General: Skin is warm and dry.  Psychiatric:        Mood and Affect: Mood normal.        Behavior: Behavior normal.        Thought Content: Thought content normal.        Judgment: Judgment normal.  Vitals reviewed.    Assessment/Plan: Encounter for annual routine gynecological examination  Cervical cancer screening - Plan: Cytology - PAP, CANCELED: Cytology - PAP  Cervical high risk human papillomavirus (HPV) DNA test positive - Plan: Cytology - PAP, repeat pap today  Screening for HPV (human papillomavirus) - Plan: Cytology - PAP  Encounter for screening mammogram for malignant neoplasm of breast - Plan: MM 3D SCREEN BREAST BILATERAL; pt to sched mammo      GYN counsel breast self exam, mammography screening, adequate intake of calcium and vitamin D, diet and exercise     F/U  Return in about 1 year (around 02/04/2022).  Trine Fread B. Marrion Finan, PA-C 02/05/2021 11:13 AM

## 2021-02-04 ENCOUNTER — Encounter: Payer: Self-pay | Admitting: Obstetrics and Gynecology

## 2021-02-04 ENCOUNTER — Other Ambulatory Visit (HOSPITAL_COMMUNITY)
Admission: RE | Admit: 2021-02-04 | Discharge: 2021-02-04 | Disposition: A | Discharge status: Home / Self Care | Payer: BC Managed Care – PPO | Source: Ambulatory Visit | Attending: Obstetrics and Gynecology | Admitting: Obstetrics and Gynecology

## 2021-02-04 ENCOUNTER — Ambulatory Visit (INDEPENDENT_AMBULATORY_CARE_PROVIDER_SITE_OTHER): Payer: BC Managed Care – PPO | Admitting: Obstetrics and Gynecology

## 2021-02-04 ENCOUNTER — Other Ambulatory Visit: Payer: Self-pay

## 2021-02-04 VITALS — BP 130/80 | Ht 64.0 in | Wt 187.0 lb

## 2021-02-04 DIAGNOSIS — Z124 Encounter for screening for malignant neoplasm of cervix: Secondary | ICD-10-CM | POA: Diagnosis present

## 2021-02-04 DIAGNOSIS — R87810 Cervical high risk human papillomavirus (HPV) DNA test positive: Secondary | ICD-10-CM | POA: Insufficient documentation

## 2021-02-04 DIAGNOSIS — Z01419 Encounter for gynecological examination (general) (routine) without abnormal findings: Secondary | ICD-10-CM | POA: Diagnosis not present

## 2021-02-04 DIAGNOSIS — Z1151 Encounter for screening for human papillomavirus (HPV): Secondary | ICD-10-CM | POA: Diagnosis not present

## 2021-02-04 DIAGNOSIS — Z1231 Encounter for screening mammogram for malignant neoplasm of breast: Secondary | ICD-10-CM

## 2021-02-04 NOTE — Patient Instructions (Addendum)
I value your feedback and you entrusting us with your care. If you get a [facility] patient survey, I would appreciate you taking the time to let us know about your experience today. Thank you!  [facility]: [phone]  [facility]: [phone]    

## 2021-02-05 DIAGNOSIS — R87810 Cervical high risk human papillomavirus (HPV) DNA test positive: Secondary | ICD-10-CM | POA: Insufficient documentation

## 2021-02-07 LAB — CYTOLOGY - PAP
Adequacy: ABSENT
Comment: NEGATIVE
Diagnosis: UNDETERMINED — AB
High risk HPV: NEGATIVE

## 2021-02-08 ENCOUNTER — Encounter: Payer: Self-pay | Admitting: Obstetrics and Gynecology

## 2021-04-08 ENCOUNTER — Other Ambulatory Visit: Payer: Self-pay

## 2021-04-08 ENCOUNTER — Encounter: Payer: Self-pay | Admitting: Emergency Medicine

## 2021-04-08 ENCOUNTER — Ambulatory Visit
Admission: EM | Admit: 2021-04-08 | Discharge: 2021-04-08 | Disposition: A | Discharge status: Home / Self Care | Payer: BC Managed Care – PPO | Attending: Emergency Medicine | Admitting: Emergency Medicine

## 2021-04-08 DIAGNOSIS — B349 Viral infection, unspecified: Secondary | ICD-10-CM | POA: Diagnosis not present

## 2021-04-08 DIAGNOSIS — R509 Fever, unspecified: Secondary | ICD-10-CM

## 2021-04-08 DIAGNOSIS — R110 Nausea: Secondary | ICD-10-CM

## 2021-04-08 MED ORDER — ACETAMINOPHEN 325 MG PO TABS
650.0000 mg | ORAL_TABLET | Freq: Once | ORAL | Status: AC
  Administered 2021-04-08: 650 mg via ORAL

## 2021-04-08 MED ORDER — ONDANSETRON 4 MG PO TBDP: 4.0000 mg | ORAL_TABLET | Freq: Three times a day (TID) | ORAL | 0 refills | Status: AC | PRN

## 2021-04-08 NOTE — Discharge Instructions (Addendum)
We will call you with any positive results from your COVID-19/Influenza testing completed in clinic today.  If you do not receive a phone call from us within the next 3-5 days, check your MyChart for up-to-date health information related to testing completed in clinic today.   For most people this is a self-limiting process and can take anywhere from 7 - 10 days to start feeling better. A cough can last up to 3 weeks. Pay special attention to handwashing as this can help prevent the spread of the virus.   Always read the labels of cough and cold medications as they may contain some of the ingredients below.  Rest, push lots of fluids (especially water), and utilize supportive care for symptoms. You may take acetaminophen (Tylenol) every 4-6 hours and ibuprofen every 6-8 hours for muscle pain, joint pain, headaches (you may also alternate these medications). Mucinex (guaifenesin) may be taken over the counter for cough as needed can loosen phlegm. Please read the instructions and take as directed.  Sudafed (pseudophedrine) is sold behind the counter and can help reduce nasal pressure; avoid taking this if you have high blood pressure or feel jittery. Sudafed PE (phenylephrine) can be a helpful, short-term, over-the-counter alternative to limit side effects or if you have high blood pressure.  Flonase nasal spray can help alleviate congestion and sinus pressure. Many patients choose Afrin as a nasal decongestant; do not use for more than 3 days for risk of rebound (increased symptoms after stopping medication).  Saline nasal sprays or rinses can also help nasal congestion (use bottled or sterile water). Warm tea with lemon and honey can sooth sore throat and cough, as can cough drops.   Return to clinic for high fever not improving with medications, chest pain, difficulty breathing, non-stop vomiting, or coughing blood. Follow-up with your primary care provider if symptoms do not improve as expected in  the next 5-7 days.  

## 2021-04-08 NOTE — ED Triage Notes (Signed)
Patient c/o fever, generalized body aches and nonproductive cough x 1 day.  Patient endorses a temperature of 101.5 F at it's highest.   Patient endorses chills at times. Patient endorses nausea.   Patient performed an at home COVID test and reports a negative test result.   Patient hasn't taken any medications for symptoms.

## 2021-04-08 NOTE — ED Provider Notes (Signed)
EiadCHIEF COMPLAINT:   Chief Complaint  Patient presents with   Fever   Generalized Body Aches   Cough     SUBJECTIVE/HPI:   Fever Associated symptoms: cough   Cough Associated symptoms: fever   A very pleasant 40 y.o.Female presents today with cough, fever, chills, body aches onset yesterday.  Patient reports a nonproductive cough.  Patient reports a T-max of 101.5 F patient states that she has taken an at home COVID-19 test which was negative.  She also reports some nausea and states that she has not eaten today.  She does not report any diarrhea or vomiting.  No medications taken as she has not eaten. Patient does not report any shortness of breath, chest pain, palpitations, visual changes, weakness, tingling, headache, nausea, vomiting, diarrhea.   has a past medical history of Asthma, Chlamydia, Dysrhythmia, Family history of adverse reaction to anesthesia, Family history of colon cancer in father, GERD (gastroesophageal reflux disease), Leiomyoma, and Ovarian cyst.  ROS:  Review of Systems  Constitutional:  Positive for fever.  Respiratory:  Positive for cough.   See Subjective/HPI Medications, Allergies and Problem List personally reviewed in Epic today OBJECTIVE:   Vitals:   04/08/21 1530  BP: (!) 153/89  Pulse: 70  Resp: 18  Temp: (!) 100.7 F (38.2 C)  SpO2: 98%    Physical Exam   General: Appears well-developed and well-nourished. No acute distress.  HEENT Head: Normocephalic and atraumatic.  Ears: Hearing grossly intact, no drainage or visible deformity.  Nose: No nasal deviation.   Mouth/Throat: No stridor or tracheal deviation.  Non erythematous posterior pharynx noted with clear drainage present.  No white patchy exudate noted. Eyes: Conjunctivae and EOM are normal. No eye drainage or scleral icterus bilaterally.  Neck: Normal range of motion, neck is supple. No cervical, tonsillar or submandibular lymph nodes palpated.   Cardiovascular: Normal rate.  Regular rhythm; no murmurs, gallops, or rubs.  Pulm/Chest: No respiratory distress. Breath sounds normal bilaterally without wheezes, rhonchi, or rales.  Neurological: Alert and oriented to person, place, and time.  Skin: Skin is warm and dry.  No rashes, lesions, abrasions or bruising noted to skin.   Psychiatric: Normal mood, affect, behavior, and thought content.   Vital signs and nursing note reviewed.   Patient stable and cooperative with examination. PROCEDURES:    LABS/X-RAYS/EKG/MEDS:    MEDICAL DECISION MAKING:   Patient presents with cough, fever, chills, body aches onset yesterday.  Patient reports a nonproductive cough.  Patient reports a T-max of 101.5 F patient states that she has taken an at home COVID-19 test which was negative.  She also reports some nausea and states that she has not eaten today.  She does not report any diarrhea or vomiting.  No medications taken as she has not eaten. Patient does not report any shortness of breath, chest pain, palpitations, visual changes, weakness, tingling, headache, nausea, vomiting, diarrhea.  Chart review completed.  The patient did have a fever in clinic today of 100.7 F for which she was given Tylenol and tolerated well.  Advised about home treatment and care.  Did obtain a COVID-19/influenza swab.  Patient is well within timeframe to treat if either of these illnesses are positive.  Given symptoms along with assessment findings, likely viral illness.  Given her nausea, did Rx Zofran to her preferred pharmacy.  Return as needed.  Patient verbalized understanding and agreed with treatment plan.  Patient stable upon discharge. ASSESSMENT/PLAN:  1. Fever in adult - Covid-19,  Flu A+B ([facility]); Standing - Covid-19, Flu A+B ([facility])  2. Viral illness  3. Nausea without vomiting  Meds ordered this encounter  Medications   acetaminophen (TYLENOL) tablet 650 mg   ondansetron (ZOFRAN ODT) 4 MG disintegrating tablet    Sig: Take 1  tablet (4 mg total) by mouth every 8 (eight) hours as needed for up to 3 days for nausea.    Dispense:  9 tablet    Refill:  0    Order Specific Question:   Supervising Provider    Answer:   Chase Picket [id number]    Instructions about new medications and side effects provided.  Plan:   Discharge Instructions      We will call you with any positive results from your COVID-19/Influenza testing completed in clinic today.  If you do not receive a phone call from [place] within the next 3-5 days, check your MyChart for up-to-date health information related to testing completed in clinic today.   For most people this is a self-limiting process and can take anywhere from 7 - 10 days to start feeling better. A cough can last up to 3 weeks. Pay special attention to handwashing as this can help prevent the spread of the virus.   Always read the labels of cough and cold medications as they may contain some of the ingredients below.  Rest, push lots of fluids (especially water), and utilize supportive care for symptoms. You may take acetaminophen (Tylenol) every 4-6 hours and ibuprofen every 6-8 hours for muscle pain, joint pain, headaches (you may also alternate these medications). Mucinex (guaifenesin) may be taken over the counter for cough as needed can loosen phlegm. Please read the instructions and take as directed.  Sudafed (pseudophedrine) is sold behind the counter and can help reduce nasal pressure; avoid taking this if you have high blood pressure or feel jittery. Sudafed PE (phenylephrine) can be a helpful, short-term, over-the-counter alternative to limit side effects or if you have high blood pressure.  Flonase nasal spray can help alleviate congestion and sinus pressure. Many patients choose Afrin as a nasal decongestant; do not use for more than 3 days for risk of rebound (increased symptoms after stopping medication).  Saline nasal sprays or rinses can also help nasal congestion (use  bottled or sterile water). Warm tea with lemon and honey can sooth sore throat and cough, as can cough drops.   Return to clinic for high fever not improving with medications, chest pain, difficulty breathing, non-stop vomiting, or coughing blood. Follow-up with your primary care provider if symptoms do not improve as expected in the next 5-7 days.        A copy of these instructions have been given to the patient or responsible adult who demonstrated the ability to learn, asked appropriate questions, and verbalized understanding of the plan of care.  There were no barriers to learning identified.    Serafina Royals, FNP-C 04/08/21  This note was partially made with the aid of speech-to-text dictation; typographical errors are not intentional.    Serafina Royals, [place] 04/08/21 1548

## 2021-04-10 LAB — COVID-19, FLU A+B NAA
Influenza A, NAA: NOT DETECTED
Influenza B, NAA: NOT DETECTED
SARS-CoV-2, NAA: NOT DETECTED

## 2021-08-06 ENCOUNTER — Ambulatory Visit
Admission: RE | Admit: 2021-08-06 | Discharge: 2021-08-06 | Disposition: A | Discharge status: Home / Self Care | Payer: BC Managed Care – PPO | Source: Ambulatory Visit | Attending: Obstetrics and Gynecology | Admitting: Obstetrics and Gynecology

## 2021-08-06 ENCOUNTER — Other Ambulatory Visit: Payer: Self-pay

## 2021-08-06 DIAGNOSIS — Z1231 Encounter for screening mammogram for malignant neoplasm of breast: Secondary | ICD-10-CM | POA: Diagnosis present

## 2021-10-24 ENCOUNTER — Other Ambulatory Visit: Payer: Self-pay | Admitting: Student

## 2021-10-24 DIAGNOSIS — R0789 Other chest pain: Secondary | ICD-10-CM

## 2021-10-24 DIAGNOSIS — R0602 Shortness of breath: Secondary | ICD-10-CM

## 2021-10-31 ENCOUNTER — Ambulatory Visit
Admission: RE | Admit: 2021-10-31 | Discharge: 2021-10-31 | Disposition: A | Discharge status: Home / Self Care | Payer: BC Managed Care – PPO | Source: Ambulatory Visit | Attending: Student | Admitting: Student

## 2021-10-31 ENCOUNTER — Other Ambulatory Visit: Payer: BC Managed Care – PPO

## 2021-10-31 ENCOUNTER — Other Ambulatory Visit: Payer: Self-pay

## 2021-10-31 DIAGNOSIS — R0602 Shortness of breath: Secondary | ICD-10-CM | POA: Insufficient documentation

## 2021-10-31 DIAGNOSIS — R0789 Other chest pain: Secondary | ICD-10-CM | POA: Insufficient documentation

## 2021-11-18 ENCOUNTER — Telehealth: Payer: Self-pay

## 2021-11-18 NOTE — Telephone Encounter (Signed)
Left a message for the patient to return the call.  

## 2021-11-19 ENCOUNTER — Encounter: Payer: Self-pay | Admitting: Family Medicine

## 2021-11-19 ENCOUNTER — Ambulatory Visit: Payer: BC Managed Care – PPO | Admitting: Obstetrics & Gynecology

## 2021-11-19 ENCOUNTER — Ambulatory Visit: Payer: BC Managed Care – PPO | Admitting: Family Medicine

## 2021-11-19 ENCOUNTER — Other Ambulatory Visit: Payer: Self-pay

## 2021-11-19 VITALS — BP 120/60 | Ht 64.0 in | Wt 193.0 lb

## 2021-11-19 DIAGNOSIS — N920 Excessive and frequent menstruation with regular cycle: Secondary | ICD-10-CM | POA: Diagnosis not present

## 2021-11-19 DIAGNOSIS — Z01818 Encounter for other preprocedural examination: Secondary | ICD-10-CM

## 2021-11-19 NOTE — Progress Notes (Addendum)
GYNECOLOGY PROBLEM  VISIT ENCOUNTER NOTE  Subjective:   Alison Frazier is a 41 y.o. G0P0000 female here for a problem GYN visit.  Current complaints: heavy menstrual cycles "her whole life"  Menorrhagia: Reports menses in middle school and having heavy cycles lasting 7-10 days at that time. Now cycles are 5-6 days and happen monthly. Reports using a super tampon every hour throughou cycle. She reports intermittent dizziness, lightheadedness, palpitations. Has not had recent CBC or anemia work up. She and husband have been trying to conceive for years but have been unsuccessful. She does not desire a pregnancy now given her age. Reports family history of fibroids (all maternal aunts, sister).  Menses are also very crampy and painful. This has been her whole life.    Last HGB was in 2021 and was 10.5 Pap is UTD.   Denies discharge, pelvic pain, problems with intercourse or other gynecologic concerns.  Patient does have an extensive GYN surgery history with laproscopic tube and ovary removal with lysis of adhesions and bowel perforation.    Gynecologic History Patient's last menstrual period was 11/10/2020 (exact date).  Contraception: none  Health Maintenance Due  Topic Date Due   HIV Screening  Never done   Hepatitis C Screening  Never done   TETANUS/TDAP  Never done   COVID-19 Vaccine (3 - Booster for Moderna series) 02/17/2020   INFLUENZA VACCINE  Never done    The following portions of the patient's history were reviewed and updated as appropriate: allergies, current medications, past family history, past medical history, past social history, past surgical history and problem list.  Review of Systems Review of Systems  Constitutional:  Negative for chills and fever.  HENT:  Positive for congestion.   Eyes:  Negative for blurred vision and double vision.  Respiratory:  Negative for cough and shortness of breath.   Cardiovascular:  Positive for chest pain (being worked up  elsewhere). Negative for orthopnea.  Gastrointestinal:  Negative for nausea and vomiting.  Genitourinary:  Negative for dysuria, flank pain and frequency.       + hot flashes  Musculoskeletal:  Negative for myalgias.  Skin:  Negative for rash.  Neurological:  Positive for dizziness. Negative for tingling, weakness and headaches.  Endo/Heme/Allergies:  Does not bruise/bleed easily.  Psychiatric/Behavioral:  Negative for depression and suicidal ideas. The patient is nervous/anxious.       Objective:  BP 120/60    Ht 5\' 4"  (1.626 m)    Wt 193 lb (87.5 kg)    LMP 11/10/2020 (Exact Date)    BMI 33.13 kg/m  Gen: well appearing, NAD HEENT: no scleral icterus. + pale conjunctiva CV: RR Lung: Normal WOB Ext: warm well perfused  PELVIC: declined exam today. Explained utility of assessing uterine size as part of work up for potential ablation   Assessment and Plan:   1. Menorrhagia with regular cycle Discussed work up for AUB. Most likely fibroids given family history Reviewed recommendation for CBC, EMB Discussed management of bleeding with OCP, IUD-- tried OCP but "does not ever want" an IUD -Patient is interested in ablation. Dicussed possible EMB with ablation. She would like to talk about this after her [place]. Also discussed with patient that depending on results and if there are large fibroids ablation might not be the best option.  - US PELVIC COMPLETE WITH TRANSVAGINAL; Future - CBC - Iron, TIBC and Ferritin Panel - Comprehensive metabolic panel  2. Preop testing - Comprehensive metabolic panel  Please refer  to After Visit Summary for other counseling recommendations.   Return in about 4 weeks (around 12/17/2021) for w/Constant after [place] to discuss DC with ablation. .  Future Appointments  Date Time Provider [place]  12/11/2021  4:00 PM WS-WS [place] 2 WS-IMG None   Caren Macadam, MD, MPH, ABFM Attending [place] for [facility]

## 2021-11-19 NOTE — Telephone Encounter (Signed)
Pt called back very unhappy about having to reschedule. She was told "last week when she scheduled" her appt that this was the only time she could be seen. I advised that Kenton Kingfisher is unexpectedly out today. She said that she had to take the day off from work and get a sub. I scheduled her with Dr Ernestina Patches who is in clinic today. I told her that I am not sure if she will be the MD that preforms the ablation but that she can consult her about the procedure today when she comes in.

## 2021-11-20 ENCOUNTER — Telehealth: Payer: Self-pay | Admitting: Obstetrics and Gynecology

## 2021-11-20 LAB — CBC
Hematocrit: 37.5 % (ref 34.0–46.6)
Hemoglobin: 12.1 g/dL (ref 11.1–15.9)
MCH: 27 pg (ref 26.6–33.0)
MCHC: 32.3 g/dL (ref 31.5–35.7)
MCV: 84 fL (ref 79–97)
Platelets: 343 10*3/uL (ref 150–450)
RBC: 4.48 x10E6/uL (ref 3.77–5.28)
RDW: 12.8 % (ref 11.7–15.4)
WBC: 4.5 10*3/uL (ref 3.4–10.8)

## 2021-11-20 LAB — IRON,TIBC AND FERRITIN PANEL
Ferritin: 33 ng/mL (ref 15–150)
Iron Saturation: 26 % (ref 15–55)
Iron: 84 ug/dL (ref 27–159)
Total Iron Binding Capacity: 317 ug/dL (ref 250–450)
UIBC: 233 ug/dL (ref 131–425)

## 2021-11-20 LAB — COMPREHENSIVE METABOLIC PANEL
ALT: 17 IU/L (ref 0–32)
AST: 17 IU/L (ref 0–40)
Albumin/Globulin Ratio: 1.4 (ref 1.2–2.2)
Albumin: 4 g/dL (ref 3.8–4.8)
Alkaline Phosphatase: 70 IU/L (ref 44–121)
BUN/Creatinine Ratio: 10 (ref 9–23)
BUN: 10 mg/dL (ref 6–24)
Bilirubin Total: 0.5 mg/dL (ref 0.0–1.2)
CO2: 22 mmol/L (ref 20–29)
Calcium: 9 mg/dL (ref 8.7–10.2)
Chloride: 104 mmol/L (ref 96–106)
Creatinine, Ser: 1 mg/dL (ref 0.57–1.00)
Globulin, Total: 2.9 g/dL (ref 1.5–4.5)
Glucose: 87 mg/dL (ref 70–99)
Potassium: 3.8 mmol/L (ref 3.5–5.2)
Sodium: 142 mmol/L (ref 134–144)
Total Protein: 6.9 g/dL (ref 6.0–8.5)
eGFR: 72 mL/min/{1.73_m2} (ref >59)

## 2021-11-20 NOTE — Telephone Encounter (Signed)
This pt is scheduled for an [place] on 3/1.  Dr. Ernestina Patches wanted her to  follow up with you in 4 weeks after the [place].  Does she need to wait the 4 weeks or can she come back on 3/8?  Alison Frazier is having me ask about the earlier date, because we don't have the schedule for you in 4 weeks.)

## 2021-11-21 NOTE — Telephone Encounter (Signed)
OK.  Thank you.  I have her scheduled with you on 3/8 at 3:55.

## 2021-11-26 ENCOUNTER — Ambulatory Visit
Admission: RE | Admit: 2021-11-26 | Discharge: 2021-11-26 | Disposition: A | Discharge status: Home / Self Care | Payer: BC Managed Care – PPO | Source: Ambulatory Visit | Attending: Family Medicine | Admitting: Family Medicine

## 2021-11-26 DIAGNOSIS — N920 Excessive and frequent menstruation with regular cycle: Secondary | ICD-10-CM | POA: Insufficient documentation

## 2021-12-03 ENCOUNTER — Other Ambulatory Visit: Payer: Self-pay

## 2021-12-03 ENCOUNTER — Encounter: Payer: Self-pay | Admitting: Obstetrics and Gynecology

## 2021-12-03 ENCOUNTER — Ambulatory Visit: Payer: BC Managed Care – PPO | Admitting: Obstetrics and Gynecology

## 2021-12-03 VITALS — BP 118/82 | Ht 64.0 in | Wt 194.0 lb

## 2021-12-03 DIAGNOSIS — N920 Excessive and frequent menstruation with regular cycle: Secondary | ICD-10-CM | POA: Diagnosis not present

## 2021-12-03 DIAGNOSIS — Z712 Person consulting for explanation of examination or test findings: Secondary | ICD-10-CM | POA: Diagnosis not present

## 2021-12-03 DIAGNOSIS — Z319 Encounter for procreative management, unspecified: Secondary | ICD-10-CM

## 2021-12-03 NOTE — Progress Notes (Signed)
41 yo P0 presenting to discuss results of recent pelvic ultrasound and management of her menorrhagia with regular cycles. Patient reports a monthly period heavy in flow and associated with dysmenorrhea. Patient is ready for intervention to stop the heavy bleeding and associated cramping.  ? ?Past Medical History:  ?Diagnosis Date  ? Asthma   ? inhalers in the past  ? Chlamydia   ? Dysrhythmia   ? PVC's  ? Family history of adverse reaction to anesthesia   ? mother died during hip fracture surgery  ? Family history of colon cancer in father   ? GERD (gastroesophageal reflux disease)   ? not recent  ? Leiomyoma   ? Ovarian cyst   ? ?Past Surgical History:  ?Procedure Laterality Date  ? APPENDECTOMY    ? CHROMOPERTUBATION N/A 03/14/2020  ? Procedure: CHROMOPERTUBATION;  Surgeon: Gae Dry, MD;  Location: [facility] ORS;  Service: Gynecology;  Laterality: N/A;  ? DILATION AND CURETTAGE OF UTERUS    ? dnc    ? INFERIOR OBLIQUE MYECTOMY    ? LAPAROSCOPIC LYSIS OF ADHESIONS  03/14/2020  ? Procedure: LAPAROSCOPIC LYSIS OF ADHESIONS;  Surgeon: Gae Dry, MD;  Location: [facility] ORS;  Service: Gynecology;;  ? LAPAROSCOPIC UNILATERAL SALPINGO OOPHERECTOMY N/A 03/14/2020  ? Procedure: OPERATIVE LAPAROSCOPY RIGHT SALPINGECTOMY AND CHROMOPERTUBATION;  Surgeon: Gae Dry, MD;  Location: [facility] ORS;  Service: Gynecology;  Laterality: N/A;  ? LAPAROSCOPY N/A 03/17/2020  ? Procedure: ROBOTIC ASSISTED LAPAROSCOPY, DIAGNOSTIC;  Surgeon: Ronny Bacon, MD;  Location: [facility] ORS;  Service: General;  Laterality: N/A;  ? OVARIAN CYST REMOVAL    ? SMALL BOWEL REPAIR N/A 03/17/2020  ? Procedure: SMALL BOWEL REPAIR, laparoscopic robotic assisted.;  Surgeon: Ronny Bacon, MD;  Location: [facility] ORS;  Service: General;  Laterality: N/A;  ? ?Family History  ?Problem Relation Age of Onset  ? Stroke Mother   ? Diabetes Father   ? Hypertension Father   ? Colon cancer Father   ? Throat cancer Father   ? Breast cancer Neg Hx   ? Ovarian cancer  Neg Hx   ? ?Social History  ? ?Tobacco Use  ? Smoking status: Never  ? Smokeless tobacco: Never  ?Vaping Use  ? Vaping Use: Never used  ?Substance Use Topics  ? Alcohol use: Not Currently  ? Drug use: Not Currently  ? ?ROS ?See pertinent in HPI. All other systems reviewed and non contributory ?Blood pressure 118/82, height '5\' 4"'$  (1.626 m), weight 194 lb (88 kg), last menstrual period 11/10/2021. ? ?GENERAL: Well-developed, well-nourished female in no acute distress.  ?ABDOMEN: Soft, nontender, nondistended. No organomegaly. ?PELVIC: Patient declined ?EXTREMITIES: No cyanosis, clubbing, or edema, 2+ distal pulses. ? ?US PELVIC COMPLETE WITH TRANSVAGINAL ? ?Result Date: 11/26/2021 ?CLINICAL DATA:  Menorrhagia Abnormal uterine bleeding Concern for fibroids EXAM: TRANSABDOMINAL AND TRANSVAGINAL ULTRASOUND OF PELVIS TECHNIQUE: Both transabdominal and transvaginal ultrasound examinations of the pelvis were performed. Transabdominal technique was performed for global imaging of the pelvis including uterus, ovaries, adnexal regions, and pelvic cul-de-sac. It was necessary to proceed with endovaginal exam following the transabdominal exam to visualize the uterus, endometrium, ovaries, and adnexa. COMPARISON:  01/10/2020 FINDINGS: Uterus Measurements: 7.1 x 4.2 x 4.7 cm = volume: 74 mL. 1.1 cm fibroid noted in the posterior right uterine body. 1.9 cm fibroid noted in the left uterine fundus. Endometrium Thickness: 11 mm.  No focal abnormality visualized. Right ovary Measurements: 4.0 x 2.6 x 2.7 cm = volume: 14 mL. Normal appearance. Left ovary Measurements:  4.1 x 2.3 x 2.1 cm = volume: 10 mL. Normal appearance. Other findings No abnormal free fluid. IMPRESSION: 1. Two small uterine fibroids. 2. If bleeding remains unresponsive to hormonal or medical therapy, sonohysterogram should be considered for focal lesion work-up. (Ref: Radiological Reasoning: Algorithmic Workup of Abnormal Vaginal Bleeding with Endovaginal Sonography  and Sonohysterography. AJR 2008; [id number]:[id number]) Electronically Signed   By: Miachel Roux M.D.   On: 11/26/2021 17:08   ? ? ?A/P 41 yo with menorrhagia and small fibroid uterus ?- Reviewed ultrasound results with the patient ?- Discussed medical management with hormonal options vs endometrial ablation. Patient is interested in endometrial ablation. Risks, benefits and alternatives were explained including but not limited to risks of bleeding, infection, uterine perforation and damage to adjacent organs. Patient verbalized understanding and all questions were answered ?- Discussed the need for endometrial biopsy prior to the ablation and patient desires to reschedule endometrial biopsy following pre-treatment with ativan ?- Patient later expressed how depressing she found the idea of having an ablation as she has been trying to conceive for years but due to an abnormal semen analysis she has not been able to. Patient states that she has seen several infertility specialist in [place], [place] without any interventions. She states that IUI was never an option ?- Given the severe impact on fertility caused by the endometrial ablation, patient desires to be referred to an infertility one last time. Patient referred to Dr. Kerin Perna ?- Patient to return when ready for endometrial biopsy at her convenience in preparation for endometrial ablation ?- Discussed management of dysmenorrhea with schedule dosed of NSAID 2 days prior onset of menses and during the first 2 days of menses ?

## 2021-12-11 ENCOUNTER — Other Ambulatory Visit: Payer: BC Managed Care – PPO

## 2022-01-03 ENCOUNTER — Other Ambulatory Visit: Payer: Self-pay

## 2022-01-03 ENCOUNTER — Emergency Department: Payer: BC Managed Care – PPO

## 2022-01-03 ENCOUNTER — Emergency Department
Admission: EM | Admit: 2022-01-03 | Discharge: 2022-01-03 | Disposition: A | Discharge status: Home / Self Care | Payer: BC Managed Care – PPO | Attending: Emergency Medicine | Admitting: Emergency Medicine

## 2022-01-03 DIAGNOSIS — J209 Acute bronchitis, unspecified: Secondary | ICD-10-CM | POA: Diagnosis not present

## 2022-01-03 DIAGNOSIS — R112 Nausea with vomiting, unspecified: Secondary | ICD-10-CM | POA: Diagnosis not present

## 2022-01-03 DIAGNOSIS — R042 Hemoptysis: Secondary | ICD-10-CM | POA: Diagnosis not present

## 2022-01-03 DIAGNOSIS — R509 Fever, unspecified: Secondary | ICD-10-CM | POA: Diagnosis present

## 2022-01-03 LAB — COMPREHENSIVE METABOLIC PANEL
ALT: 16 U/L (ref 0–44)
AST: 18 U/L (ref 15–41)
Albumin: 3.9 g/dL (ref 3.5–5.0)
Alkaline Phosphatase: 62 U/L (ref 38–126)
Anion gap: 7 (ref 5–15)
BUN: 8 mg/dL (ref 6–20)
CO2: 25 mmol/L (ref 22–32)
Calcium: 8.9 mg/dL (ref 8.9–10.3)
Chloride: 104 mmol/L (ref 98–111)
Creatinine, Ser: 0.87 mg/dL (ref 0.44–1.00)
GFR, Estimated: >60 mL/min (ref >60)
Glucose, Bld: 98 mg/dL (ref 70–99)
Potassium: 3.9 mmol/L (ref 3.5–5.1)
Sodium: 136 mmol/L (ref 135–145)
Total Bilirubin: 0.9 mg/dL (ref 0.3–1.2)
Total Protein: 8 g/dL (ref 6.5–8.1)

## 2022-01-03 LAB — CBC WITH DIFFERENTIAL/PLATELET
Abs Immature Granulocytes: 0 10*3/uL (ref 0.00–0.07)
Basophils Absolute: 0 10*3/uL (ref 0.0–0.1)
Basophils Relative: 1 %
Eosinophils Absolute: 0.1 10*3/uL (ref 0.0–0.5)
Eosinophils Relative: 3 %
HCT: 41 % (ref 36.0–46.0)
Hemoglobin: 13.2 g/dL (ref 12.0–15.0)
Immature Granulocytes: 0 %
Lymphocytes Relative: 35 %
Lymphs Abs: 1.7 10*3/uL (ref 0.7–4.0)
MCH: 26.3 pg (ref 26.0–34.0)
MCHC: 32.2 g/dL (ref 30.0–36.0)
MCV: 81.7 fL (ref 80.0–100.0)
Monocytes Absolute: 0.3 10*3/uL (ref 0.1–1.0)
Monocytes Relative: 6 %
Neutro Abs: 2.6 10*3/uL (ref 1.7–7.7)
Neutrophils Relative %: 55 %
Platelets: 372 10*3/uL (ref 150–400)
RBC: 5.02 MIL/uL (ref 3.87–5.11)
RDW: 12.3 % (ref 11.5–15.5)
WBC: 4.8 10*3/uL (ref 4.0–10.5)
nRBC: 0 % (ref 0.0–0.2)

## 2022-01-03 MED ORDER — ONDANSETRON 4 MG PO TBDP: 4.0000 mg | ORAL_TABLET | Freq: Three times a day (TID) | ORAL | 0 refills | Status: DC | PRN

## 2022-01-03 MED ORDER — FAMOTIDINE 20 MG PO TABS: 20.0000 mg | ORAL_TABLET | Freq: Two times a day (BID) | ORAL | 0 refills | Status: DC

## 2022-01-03 MED ORDER — PREDNISONE 10 MG (21) PO TBPK: ORAL_TABLET | ORAL | 0 refills | Status: DC

## 2022-01-03 NOTE — ED Provider Notes (Signed)
? ?[facility] ?Provider Note ? ? ? Event Date/Time  ? First MD Initiated Contact with Patient 01/03/22 1216   ?  (approximate) ? ? ?History  ? ?Hemoptysis ? ? ?HPI ? ?Alison Frazier is a 41 y.o. female with a past history of uterine fibroids who comes ED complaining of fevers chills, hemoptysis nausea vomiting and body aches for the past week.  Symptoms are waxing and waning, no aggravating or alleviating factors.  No productive cough.  Denies any chest pain or pleuritic symptoms, no exertional symptoms.  No black or bloody stool or hematemesis. ?  ? ? ?Physical Exam  ? ?Triage Vital Signs: ?ED Triage Vitals  ?Enc Vitals Group  ?   BP 01/03/22 1112 (!) 161/134  ?   Pulse Rate 01/03/22 1112 66  ?   Resp 01/03/22 1112 15  ?   Temp 01/03/22 1112 98.6 ?F (37 ?C)  ?   Temp Source 01/03/22 1112 Oral  ?   SpO2 01/03/22 1112 92 %  ?   Weight 01/03/22 1108 187 lb (84.8 kg)  ?   Height 01/03/22 1108 '5\' 4"'$  (1.626 m)  ?   Head Circumference --   ?   Peak Flow --   ?   Pain Score 01/03/22 1108 7  ?   Pain Loc --   ?   Pain Edu? --   ?   Excl. in [place]? --   ? ? ?Most recent vital signs: ?Vitals:  ? 01/03/22 1236 01/03/22 1300  ?BP: 131/82 (!) 150/82  ?Pulse: 60 60  ?Resp: 16 16  ?Temp: 98.7 ?F (37.1 ?C)   ?SpO2: 99% 99%  ? ? ? ?General: Awake, no distress.  ?CV:  Good peripheral perfusion.  Regular rate and rhythm ?Resp:  Normal effort.  Clear to auscultation bilaterally.  No inducible wheezing with FEV1 maneuver. ?Abd:  No distention.  Soft and nontender ?Other:  No lower extremity edema or calf tenderness.  Symmetric calf circumference. ? ? ?ED Results / Procedures / Treatments  ? ?Labs ?(all labs ordered are listed, but only abnormal results are displayed) ?Labs Reviewed  ?CBC WITH DIFFERENTIAL/PLATELET  ?COMPREHENSIVE METABOLIC PANEL  ?POC URINE PREG, ED  ? ? ? ?EKG ? ?Interpreted by me ?Sinus rhythm rate of 79.  Normal axis and intervals.  Normal QRS ST segments and T waves.  4 PVCs on the  strip. ? ? ?RADIOLOGY ?Chest x-ray viewed and interpreted by me, appears normal.  Radiology report reviewed ? ? ? ?PROCEDURES: ? ?Critical Care performed: No ? ?Procedures ? ? ?MEDICATIONS ORDERED IN ED: ?Medications - No data to display ? ? ?IMPRESSION / MDM / ASSESSMENT AND PLAN / ED COURSE  ?I reviewed the triage vital signs and the nursing notes. ?             ?               ? ?Differential diagnosis includes, but is not limited to, pneumonia, pleural effusion, pulmonary edema, viral illness ? ?Patient presents with multiple respiratory symptoms for the past week consistent with viral illness and bronchitis. Considering the patient's symptoms, medical history, and physical examination today, I have low suspicion for ACS, PE, TAD, pneumothorax, carditis, mediastinitis, pneumonia, CHF, or sepsis. ? ?Labs and chest x-ray unremarkable.  Exam is overall reassuring and she is nontoxic, not requiring hospitalization.  Will treat supportively with prednisone taper, Zofran, Pepcid, follow-up with primary care. ? ? ?Clinical Course as of 01/03/22 1314  ?  Sat Jan 03, 2022  ?[place] ?[place] [PS]  ?  ?Clinical Course User Index ?[PS] Carrie Mew, MD  ? ? ? ?FINAL CLINICAL IMPRESSION(S) / ED DIAGNOSES  ? ?Final diagnoses:  ?Acute bronchitis, unspecified organism  ? ? ? ?Rx / DC Orders  ? ?ED Discharge Orders   ? ?      Ordered  ?  predniSONE (STERAPRED UNI-PAK 21 TAB) 10 MG (21) TBPK tablet       ? 01/03/22 1314  ?  ondansetron (ZOFRAN-ODT) 4 MG disintegrating tablet  Every 8 hours PRN       ? 01/03/22 1314  ?  famotidine (PEPCID) 20 MG tablet  2 times daily       ? 01/03/22 1314  ? ?  ?  ? ?  ? ? ? ?Note:  This document was prepared using Dragon voice recognition software and may include unintentional dictation errors. ?  ?Carrie Mew, MD ?01/03/22 1317 ? ?

## 2022-01-03 NOTE — ED Triage Notes (Signed)
Pt c/o fever, bloody sputum, N/V since last Sunday. States she went to the urgent care and they referred her to the ED, pt is in NAD on arrival. Ambulatory with a steady gait ?

## 2022-01-03 NOTE — ED Notes (Signed)
Signature pad not working. Verbal DC consent given. ?

## 2022-01-03 NOTE — ED Notes (Signed)
RN to bedside to introduce self to pt. Pt states "im fixing to be discharged". Pt CAOx4 and in no acute distress. ?

## 2022-01-30 ENCOUNTER — Emergency Department
Admission: EM | Admit: 2022-01-30 | Discharge: 2022-01-30 | Disposition: A | Discharge status: Home / Self Care | Payer: BC Managed Care – PPO | Attending: Emergency Medicine | Admitting: Emergency Medicine

## 2022-01-30 ENCOUNTER — Other Ambulatory Visit: Payer: Self-pay

## 2022-01-30 ENCOUNTER — Emergency Department: Payer: BC Managed Care – PPO

## 2022-01-30 DIAGNOSIS — R0789 Other chest pain: Secondary | ICD-10-CM | POA: Insufficient documentation

## 2022-01-30 DIAGNOSIS — J45909 Unspecified asthma, uncomplicated: Secondary | ICD-10-CM | POA: Diagnosis not present

## 2022-01-30 DIAGNOSIS — I10 Essential (primary) hypertension: Secondary | ICD-10-CM | POA: Insufficient documentation

## 2022-01-30 LAB — BASIC METABOLIC PANEL
Anion gap: 9 (ref 5–15)
BUN: 11 mg/dL (ref 6–20)
CO2: 22 mmol/L (ref 22–32)
Calcium: 9 mg/dL (ref 8.9–10.3)
Chloride: 106 mmol/L (ref 98–111)
Creatinine, Ser: 0.87 mg/dL (ref 0.44–1.00)
GFR, Estimated: >60 mL/min (ref >60)
Glucose, Bld: 88 mg/dL (ref 70–99)
Potassium: 3.7 mmol/L (ref 3.5–5.1)
Sodium: 137 mmol/L (ref 135–145)

## 2022-01-30 LAB — CBC
HCT: 39.9 % (ref 36.0–46.0)
Hemoglobin: 12.7 g/dL (ref 12.0–15.0)
MCH: 25.9 pg — ABNORMAL LOW (ref 26.0–34.0)
MCHC: 31.8 g/dL (ref 30.0–36.0)
MCV: 81.3 fL (ref 80.0–100.0)
Platelets: 365 10*3/uL (ref 150–400)
RBC: 4.91 MIL/uL (ref 3.87–5.11)
RDW: 12.7 % (ref 11.5–15.5)
WBC: 4.6 10*3/uL (ref 4.0–10.5)
nRBC: 0 % (ref 0.0–0.2)

## 2022-01-30 LAB — TROPONIN I (HIGH SENSITIVITY)
Troponin I (High Sensitivity): <2 ng/L (ref <18)
Troponin I (High Sensitivity): 3 ng/L (ref <18)

## 2022-01-30 MED ORDER — HYDROCODONE-ACETAMINOPHEN 5-325 MG PO TABS: 1.0000 | ORAL_TABLET | Freq: Four times a day (QID) | ORAL | 0 refills | Status: DC | PRN

## 2022-01-30 NOTE — Discharge Instructions (Addendum)
Follow-up with your primary care provider if any continued problems or concerns.  Also begin taking the leaf 2 tablets twice a day with food.  A prescription for hydrocodone was sent to your pharmacy if you need something stronger.  Avoid lifting, pushing, reaching above shoulder level for the next several days to see if this helps with your chest wall pain.  If any severe worsening of your symptoms return to the emergency department.  Today your cardiac enzymes twice were negative which is reassuring that you are not having an acute cardiac event. ?

## 2022-01-30 NOTE — ED Provider Notes (Signed)
? ?[facility] ?Provider Note ? ? ? Event Date/Time  ? First MD Initiated Contact with Patient 01/30/22 1334   ?  (approximate) ? ? ?History  ? ?Chest Pain ? ? ?HPI ? ?Alison Frazier is a 41 y.o. female   presents to the ED with complaint of right-sided chest pain that radiates into bilateral arms and shoulders and through to her back.  Patient describes pain is heavy and pressure that started this morning.  Patient reports that school nurse took her blood pressure and it was elevated at 180/100.  Patient reports she does have a history of hypertension and complies with her antihypertensive medications.  She reports bronchitis last month but no recent illnesses.  Patient does see Dr. Clayborn Bigness for cardiology she is aware that she has "enlarged heart".  Patient denies any nausea, vomiting, shortness of breath, diaphoresis at this time.  Patient denies any other cardiac history other than above.  Patient has history of asthma, GERD, generalized anxiety and seen at [facility].  She has also been seen at [facility] for atypical chest pain which was in January 2023. ? ?  ? ? ?Physical Exam  ? ?Triage Vital Signs: ?ED Triage Vitals  ?Enc Vitals Group  ?   BP 01/30/22 1150 (!) 148/80  ?   Pulse Rate 01/30/22 1150 69  ?   Resp 01/30/22 1150 18  ?   Temp 01/30/22 1150 98.2 ?F (36.8 ?C)  ?   Temp Source 01/30/22 1150 Oral  ?   SpO2 01/30/22 1150 100 %  ?   Weight 01/30/22 1150 191 lb (86.6 kg)  ?   Height 01/30/22 1150 5' 4" (1.626 m)  ?   Head Circumference --   ?   Peak Flow --   ?   Pain Score 01/30/22 1155 6  ?   Pain Loc --   ?   Pain Edu? --   ?   Excl. in [place]? --   ? ? ?Most recent vital signs: ?Vitals:  ? 01/30/22 1150 01/30/22 1415  ?BP: (!) 148/80 140/82  ?Pulse: 69 70  ?Resp: 18 18  ?Temp: 98.2 ?F (36.8 ?C)   ?SpO2: 100% 100%  ? ? ? ?General: Awake, no distress.  Able to talk in complete sentences without any difficulty.  She is also noted to be ambulatory without any  assistance. ?CV:  Good peripheral perfusion.  Regular rate and rhythm. ?Resp:  Normal effort. Lungs are clear bilaterally. ?Abd:  No distention.  ?Other:  No edema noted lower extremities and patient is able to ambulate without any assistance.  I spoke with patient several times while in the emergency department and had she did not appear to be any shortness of breath or distress. ? ? ?ED Results / Procedures / Treatments  ? ?Labs ?(all labs ordered are listed, but only abnormal results are displayed) ?Labs Reviewed  ?CBC - Abnormal; Notable for the following components:  ?    Result Value  ? MCH 25.9 (*)   ? All other components within normal limits  ?BASIC METABOLIC PANEL  ?POC URINE PREG, ED  ?TROPONIN I (HIGH SENSITIVITY)  ?TROPONIN I (HIGH SENSITIVITY)  ? ? ? ?EKG ? ?Vent. rate 100 BPM ?PR interval 150 ms ?QRS duration 72 ms ?QT/QTcB 382/492 ms ?P-R-T axes 50 31 12 ?Sinus rhythm with frequent Premature ventricular complexes ?When compared with ECG of 03-Jan-2022 11:17, ?No significant change was found ? ? ? ?RADIOLOGY ?Chest x-ray images were reviewed  by myself and no acute changes are noted.  Radiology report also confirms no acute cardiopulmonary changes. ? ? ? ?PROCEDURES: ? ?Critical Care performed:  ? ?Procedures ? ? ?MEDICATIONS ORDERED IN ED: ?Medications - No data to display ? ? ?IMPRESSION / MDM / ASSESSMENT AND PLAN / ED COURSE  ?I reviewed the triage vital signs and the nursing notes. ? ? ?Differential diagnosis includes, but is not limited to, chest wall pain, acute MI, bronchitis, pneumonia, costochondritis. ? ?41 year old female presents to the ED with complaint of chest wall pain that started suddenly today without history of injury.  Patient states that it is right-sided in nature and radiates down her bilateral arms and shoulders.  She also reports elevated blood pressure while at school.  She currently sees Dr. Clayborn Bigness at [facility] cardiology and EKG from her last office visit was  compared with present EKG and was similar.  Last x-ray was negative for bronchitis or pneumonia.  CBC and met the were reassuring.  Troponin x2 were both negative.  I discussed findings with patient with instructions to follow-up with Dr. Clayborn Bigness or her primary care provider if any continued problems.  I discussed the significance of her troponin test and that it was done twice.  Patient was given a prescription for hydrocodone to take as needed for chest wall pain.  She is return to the emergency department if any worsening of her symptoms over the weekend, increased pain, shortness of breath, diaphoresis, or not improving.  Information about chest wall pain was given to the patient. ? ? ? ?  ? ? ?FINAL CLINICAL IMPRESSION(S) / ED DIAGNOSES  ? ?Final diagnoses:  ?Chest wall pain  ? ? ? ?Rx / DC Orders  ? ?ED Discharge Orders   ? ?      Ordered  ?  HYDROcodone-acetaminophen (NORCO/VICODIN) 5-325 MG tablet  Every 6 hours PRN       ? 01/30/22 1505  ? ?  ?  ? ?  ? ? ? ?Note:  This document was prepared using Dragon voice recognition software and may include unintentional dictation errors. ?  ?Johnn Hai, PA-C ?01/30/22 1609 ? ?  ?Blake Divine, MD ?01/30/22 1625 ? ?

## 2022-01-30 NOTE — ED Notes (Signed)
Patient refuses to provide urine sample for POC urine preg test. Pt states "I know I'm not pregnant. I just had a vaginal ultrasound Tuesday." MD made aware.  ?

## 2022-01-30 NOTE — ED Triage Notes (Signed)
Patient to ER via POV. Reports sudden onset of right sided chest pain, radiates into bilateral arms and shoulders. Describes pain as heavy/ pressure. Patient was at work when pain started this morning. Reports school nurse took her blood pressure and it was elevated at 180/100.  ? ?Reports history of hypertension and takes medications as prescribed. Reports bronchitis last month but no recent illness.  ? ? ?

## 2022-02-16 ENCOUNTER — Telehealth: Payer: Self-pay

## 2022-02-16 NOTE — Telephone Encounter (Signed)
Triage voicemail: Patient reports she has not had a period in 41 days. Last period 12/08/21. She states she is definitely not pregnant. She was sick in April and was on steriods. Uncertain if this is a contributing factor. She did not have a period in April and has not had one this month. Very concerned and would like insight as to what is going on. [id number]#[phone]

## 2022-02-16 NOTE — Telephone Encounter (Signed)
Spoke with patient. She was on a 6 day prednisone taper. She has not taken a pregnancy test as she has been abstinent as she is currently seeing infertility and awaiting a MFM appointment next month. She's concerned that her periods were regular and now that she is seeking fertility assistance, they have stopped.

## 2022-02-19 NOTE — Telephone Encounter (Signed)
Patient calling back in regards to medication and missed period. Would like a call back

## 2022-02-20 NOTE — Telephone Encounter (Signed)
Spoke w/patient. She advised she started her period again yesterday. She will await appointment with Governor Specking

## 2022-02-20 NOTE — Telephone Encounter (Signed)
Left voicemail to nofity of Dr. Romona Curls reply. Advised to return call with any additional questions/concerns.

## 2022-04-21 ENCOUNTER — Other Ambulatory Visit: Payer: Self-pay | Admitting: Obstetrics and Gynecology

## 2022-04-21 ENCOUNTER — Telehealth: Payer: Self-pay

## 2022-04-21 MED ORDER — LORAZEPAM 0.5 MG PO TABS: 0.5000 mg | ORAL_TABLET | Freq: Once | ORAL | 0 refills | Status: AC

## 2022-04-21 NOTE — Progress Notes (Signed)
Rx ativan to take 1 hr before endometrial bx

## 2022-04-21 NOTE — Telephone Encounter (Signed)
Patient called office stating that she is scheduled for a endometrial biopsy on 05/12/22 patient requested that a sedative be called into pharmacy before her appt for her to take. Patient uses [facility] on [address] KW

## 2022-04-21 NOTE — Telephone Encounter (Signed)
Patient advised.KW 

## 2022-04-21 NOTE — Telephone Encounter (Signed)
Rx ativan #1 tab eRxd. Take 1 hr before appt.

## 2022-05-12 ENCOUNTER — Ambulatory Visit: Payer: BC Managed Care – PPO | Admitting: Obstetrics and Gynecology

## 2022-05-12 ENCOUNTER — Other Ambulatory Visit (HOSPITAL_COMMUNITY)
Admission: RE | Admit: 2022-05-12 | Discharge: 2022-05-12 | Disposition: A | Discharge status: Home / Self Care | Payer: BC Managed Care – PPO | Source: Ambulatory Visit | Attending: Obstetrics and Gynecology | Admitting: Obstetrics and Gynecology

## 2022-05-12 ENCOUNTER — Encounter: Payer: Self-pay | Admitting: Obstetrics and Gynecology

## 2022-05-12 VITALS — BP 100/60 | Ht 64.0 in | Wt 188.0 lb

## 2022-05-12 DIAGNOSIS — N920 Excessive and frequent menstruation with regular cycle: Secondary | ICD-10-CM

## 2022-05-12 MED ORDER — TRANEXAMIC ACID 650 MG PO TABS: 1300.0000 mg | ORAL_TABLET | Freq: Three times a day (TID) | ORAL | 11 refills | Status: DC

## 2022-05-12 NOTE — Progress Notes (Signed)
Alison Crow, MD   Chief Complaint  Patient presents with   Endometrial Biopsy    Heavier and painful cycles    HPI:      Alison Frazier is a 41 y.o. G0P0000 whose LMP was Patient's last menstrual period was 04/19/2022 (exact date)., presents today for EMB in order to have endometrial ablation for menorrhagia. GYN u/s 3/23 showed 2 small leio and EM=11 mm. Pt saw Dr. Elly Modena who recommended hormones vs ablation. Pt declines hormones, wants to proceed with ablation. Hx of infertility and saw REI who said IVF was only option. Pt sad that ablation means no future pregnancy but tired of dealing with periods.  Hasn't tried lysteda.  Patient Active Problem List   Diagnosis Date Noted   Cervical high risk HPV (human papillomavirus) test positive 02/05/2021   Perforated abdominal viscus    Ileus ([place]) 03/17/2020   S/P laparoscopy 03/14/2020   Pelvic pain 01/29/2020   Leiomyoma 11/08/2019   Hydrosalpinx 11/08/2019    Past Surgical History:  Procedure Laterality Date   APPENDECTOMY     CHROMOPERTUBATION N/A 03/14/2020   Procedure: CHROMOPERTUBATION;  Surgeon: Gae Dry, MD;  Location: [facility];  Service: Gynecology;  Laterality: N/A;   DILATION AND CURETTAGE OF UTERUS     dnc     INFERIOR OBLIQUE MYECTOMY     LAPAROSCOPIC LYSIS OF ADHESIONS  03/14/2020   Procedure: LAPAROSCOPIC LYSIS OF ADHESIONS;  Surgeon: Gae Dry, MD;  Location: [facility];  Service: Gynecology;;   LAPAROSCOPIC UNILATERAL SALPINGO OOPHERECTOMY N/A 03/14/2020   Procedure: OPERATIVE LAPAROSCOPY RIGHT SALPINGECTOMY AND CHROMOPERTUBATION;  Surgeon: Gae Dry, MD;  Location: [facility];  Service: Gynecology;  Laterality: N/A;   LAPAROSCOPY N/A 03/17/2020   Procedure: ROBOTIC ASSISTED LAPAROSCOPY, DIAGNOSTIC;  Surgeon: Ronny Bacon, MD;  Location: [facility];  Service: General;  Laterality: N/A;   OVARIAN CYST REMOVAL     SMALL BOWEL REPAIR N/A 03/17/2020   Procedure: SMALL BOWEL REPAIR,  laparoscopic robotic assisted.;  Surgeon: Ronny Bacon, MD;  Location: [facility];  Service: General;  Laterality: N/A;    Family History  Problem Relation Age of Onset   Stroke Mother    Diabetes Father    Hypertension Father    Colon cancer Father    Throat cancer Father    Breast cancer Neg Hx    Ovarian cancer Neg Hx     Social History   Socioeconomic History   Marital status: Married    Spouse name: Not on file   Number of children: Not on file   Years of education: Not on file   Highest education level: Not on file  Occupational History   Not on file  Tobacco Use   Smoking status: Never   Smokeless tobacco: Never  Vaping Use   Vaping Use: Never used  Substance and Sexual Activity   Alcohol use: Not Currently   Drug use: Not Currently   Sexual activity: Yes    Birth control/protection: None  Other Topics Concern   Not on file  Social History Narrative   Not on file   Social Determinants of Health   Financial Resource Strain: Not on file  Food Insecurity: Not on file  Transportation Needs: Not on file  Physical Activity: Not on file  Stress: Not on file  Social Connections: Not on file  Intimate Partner Violence: Not on file    Outpatient Medications Prior to Visit  Medication Sig Dispense Refill   albuterol (VENTOLIN  HFA) 108 (90 Base) MCG/ACT inhaler INHALE 2 INHALATIONS INTO THE LUNGS EVERY 6 HOURS AS NEEDED FOR WHEEZING     carvedilol (COREG) 3.125 MG tablet TAKE 1 TABLET(3.125 MG) BY MOUTH TWICE DAILY WITH MEALS     losartan (COZAAR) 25 MG tablet Take 1 tablet by mouth daily.     HYDROcodone-acetaminophen (NORCO/VICODIN) 5-325 MG tablet Take 1 tablet by mouth every 6 (six) hours as needed for moderate pain. 15 tablet 0   No facility-administered medications prior to visit.      ROS:  Review of Systems  Constitutional:  Negative for fever.  Gastrointestinal:  Negative for blood in stool, constipation, diarrhea, nausea and vomiting.   Genitourinary:  Positive for menstrual problem. Negative for dyspareunia, dysuria, flank pain, frequency, hematuria, urgency, vaginal bleeding, vaginal discharge and vaginal pain.  Musculoskeletal:  Negative for back pain.  Skin:  Negative for rash.  BREAST: No symptoms   OBJECTIVE:   Vitals:  BP 100/60   Ht '5\' 4"'$  (1.626 m)   Wt 188 lb (85.3 kg)   LMP 04/19/2022 (Exact Date)   BMI 32.27 kg/m   Physical Exam Vitals reviewed.  Constitutional:      Appearance: She is well-developed.  Pulmonary:     Effort: Pulmonary effort is normal.  Genitourinary:    General: Normal vulva.     Pubic Area: No rash.      Labia:        Right: No rash, tenderness or lesion.        Left: No rash, tenderness or lesion.      Vagina: Normal. No vaginal discharge, erythema or tenderness.     Cervix: Normal.  Musculoskeletal:        General: Normal range of motion.     Cervical back: Normal range of motion.  Skin:    General: Skin is warm and dry.  Neurological:     General: No focal deficit present.     Mental Status: She is alert and oriented to person, place, and time.  Psychiatric:        Mood and Affect: Mood normal.        Behavior: Behavior normal.        Thought Content: Thought content normal.        Judgment: Judgment normal.    Endometrial Biopsy After discussion with the patient regarding her abnormal uterine bleeding I recommended that she proceed with an endometrial biopsy for further diagnosis. The risks, benefits, alternatives, and indications for an endometrial biopsy were discussed with the patient in detail. She understood the risks including infection, bleeding, cervical laceration and uterine perforation.  Verbal consent was obtained.   PROCEDURE NOTE:  [place] endometrial biopsy was performed using aseptic technique with iodine preparation.  The uterus was sounded to a length of 8.5 cm.  Adequate sampling was obtained with minimal blood loss.  The patient tolerated the  procedure well.  Disposition will be pending pathology.   Assessment/Plan: Menorrhagia with regular cycle - Plan: Surgical pathology, tranexamic acid (LYSTEDA) 650 MG TABS tablet; EMB today. Procedure discussed. Will refer pt to MD for ablation. Rx lysteda in meantime to see if gives any period relief.   Meds ordered this encounter  Medications   tranexamic acid (LYSTEDA) 650 MG TABS tablet    Sig: Take 2 tablets (1,300 mg total) by mouth 3 (three) times daily. Take during menses for a maximum of five days    Dispense:  30 tablet    Refill:  11  Order Specific Question:   Supervising Provider    Answer:   Renaldo Reel      Return if symptoms worsen or fail to improve.  Jabe Jeanbaptiste B. Kayleigh Broadwell, PA-C 05/12/2022 6:43 PM         Elmo Putt B. Torin Modica, PA-C [employer] 05/12/2022  3:19 PM

## 2022-05-12 NOTE — Patient Instructions (Signed)
I value your feedback and you entrusting us with your care. If you get a [facility] patient survey, I would appreciate you taking the time to let us know about your experience today. Thank you! ? ? ?

## 2022-05-14 ENCOUNTER — Encounter: Payer: Self-pay | Admitting: Obstetrics and Gynecology

## 2022-05-14 LAB — SURGICAL PATHOLOGY

## 2022-05-15 ENCOUNTER — Telehealth: Payer: Self-pay

## 2022-05-15 NOTE — Telephone Encounter (Signed)
Pt reports back pain and pelvic pain from EMB. Pain gets worse after using the bathroom, pt reports pain being 8.5 has tried ibuprofen and Excedrin and no relief.Pt states she is in pain now. Pt aware you are not in the office, sometimes you check messages while off and will respond. Marrissa Dai cma

## 2022-05-17 NOTE — Telephone Encounter (Signed)
Pls call pt to check on her on Monday.

## 2022-05-18 NOTE — Telephone Encounter (Signed)
Pt states yes she is still having pain and its worse after using the bathroom. Alison Frazier cma

## 2022-05-19 ENCOUNTER — Other Ambulatory Visit: Payer: Self-pay | Admitting: Obstetrics and Gynecology

## 2022-05-19 MED ORDER — NAPROXEN 500 MG PO TABS: 500.0000 mg | ORAL_TABLET | Freq: Two times a day (BID) | ORAL | 0 refills | Status: DC

## 2022-05-19 NOTE — Telephone Encounter (Signed)
Spoke with pt. She has achy pelvic pain, radiating to tailbone. No fevers, no increased vag d/c, no UTI sx. Sx worse after voiding and defecating. Has done ibup 800 mg, tylenol, excedrin, heating pad without relief. Most likely uterine irritability after EMB. Rx naprosyn, f/u if sx persist for Gyn u/s. Discussed with Dr. Marcelline Mates. She agrees.

## 2022-05-19 NOTE — Progress Notes (Signed)
Rx naprosyn for pain after EMB. Ibup/heating pad not helfpful

## 2022-06-01 ENCOUNTER — Other Ambulatory Visit: Payer: Self-pay | Admitting: Obstetrics and Gynecology

## 2022-07-03 ENCOUNTER — Telehealth: Payer: Self-pay | Admitting: Obstetrics and Gynecology

## 2022-07-03 NOTE — Telephone Encounter (Signed)
Patient called and states that she spoke with someone a few weeks ago about scheduling her surgery for an ablation. Pt states she was told that she would get a call back and never received one. Patient has been scheduled for surgery consult per cherry

## 2022-07-09 NOTE — Progress Notes (Unsigned)
GYNECOLOGY PROGRESS NOTE  Subjective:    Patient ID: Alison Frazier, female    DOB: Sep 11, 1981, 41 y.o.   MRN: [id number]  HPI  Patient is a 41 y.o. G0P0000 female who presents for endometrial ablation consult. Patient has had a negative workup for menorrhagia, including endometrial biopsy performed last visit with Dr. Clovia Cuff. She reports that her last cycle that was on 06/27/2022 was heavier and more painful with passage of clots.  Also was associated with with mood swings.   Of note, on review of chart, patient was seen over the past year by REI due to history of infertility. Notes that she was told that IVF was her only option and notes that she and her husband would not be able to afford it. Partner has low sperm count and some health issues. Patient also with h/o PCOS, but does note regular cycles. She reports that she has never tried any other options for conception (IUI, ovulation induction). Currently only has 1 fallopian tube, lost other tube 2 years ago. Also notes remote history of D&C due to fibroids and bleeding several years ago.    The following portions of the patient's history were reviewed and updated as appropriate:  She  has a past medical history of Asthma, Chlamydia, Dysrhythmia, Family history of adverse reaction to anesthesia, Family history of colon cancer in father, GERD (gastroesophageal reflux disease), Leiomyoma, and Ovarian cyst.  She  has a past surgical history that includes Ovarian cyst removal; Appendectomy; Inferior oblique myectomy; dnc; Dilation and curettage of uterus; Laparoscopic unilateral salpingo oophorectomy (N/A, 03/14/2020); Chromopertubation (N/A, 03/14/2020); Laparoscopic lysis of adhesions (03/14/2020); laparoscopy (N/A, 03/17/2020); and Small bowel repair (N/A, 03/17/2020).  She  reports that she has never smoked. She has never used smokeless tobacco. She reports that she does not currently use alcohol. She reports that she does not currently use  drugs.   Current Outpatient Medications on File Prior to Visit  Medication Sig Dispense Refill   albuterol (VENTOLIN HFA) 108 (90 Base) MCG/ACT inhaler INHALE 2 INHALATIONS INTO THE LUNGS EVERY 6 HOURS AS NEEDED FOR WHEEZING     carvedilol (COREG) 3.125 MG tablet TAKE 1 TABLET(3.125 MG) BY MOUTH TWICE DAILY WITH MEALS     losartan (COZAAR) 25 MG tablet Take 1 tablet by mouth daily.     No current facility-administered medications on file prior to visit.   She is allergic to contrast media [iodinated contrast media], iodine, shellfish-derived products, and other..  Review of Systems Pertinent items are noted in HPI.   Objective:   Blood pressure (!) 156/62, pulse (!) 44, resp. rate 16, height '5\' 4"'$  (1.626 m), weight 188 lb 12.8 oz (85.6 kg), last menstrual period 06/27/2022. Body mass index is 32.41 kg/m. General appearance: alert, cooperative, and no distress Remainder of exam deferred.    Labs:  Lab Results  Component Value Date   WBC 4.6 01/30/2022   HGB 12.7 01/30/2022   HCT 39.9 01/30/2022   MCV 81.3 01/30/2022   PLT 365 01/30/2022     Imaging (Pelvic Ultrasound 12/25/2021):  CLINICAL DATA:  Menorrhagia   Abnormal uterine bleeding   Concern for fibroids   EXAM: TRANSABDOMINAL AND TRANSVAGINAL ULTRASOUND OF PELVIS   TECHNIQUE: Both transabdominal and transvaginal ultrasound examinations of the pelvis were performed. Transabdominal technique was performed for global imaging of the pelvis including uterus, ovaries, adnexal regions, and pelvic cul-de-sac. It was necessary to proceed with endovaginal exam following the transabdominal exam to visualize the uterus, endometrium,  ovaries, and adnexa.   COMPARISON:  01/10/2020   FINDINGS: Uterus   Measurements: 7.1 x 4.2 x 4.7 cm = volume: 74 mL. 1.1 cm fibroid noted in the posterior right uterine body. 1.9 cm fibroid noted in the left uterine fundus.   Endometrium   Thickness: 11 mm.  No focal abnormality  visualized.   Right ovary   Measurements: 4.0 x 2.6 x 2.7 cm = volume: 14 mL. Normal appearance.   Left ovary   Measurements: 4.1 x 2.3 x 2.1 cm = volume: 10 mL. Normal appearance.   Other findings   No abnormal free fluid.   IMPRESSION: 1. Two small uterine fibroids. 2. If bleeding remains unresponsive to hormonal or medical therapy, sonohysterogram should be considered for focal lesion work-up. (Ref: Radiological Reasoning: Algorithmic Workup of Abnormal Vaginal Bleeding with Endovaginal Sonography and Sonohysterography. AJR 2008; 390:[id number])     Electronically Signed   By: Miachel Roux M.D.   On: 11/26/2021 17:08   Assessment:   1. Menorrhagia with regular cycle   2. Infertility management   3. Leiomyoma      Plan:   Menorrhagia with regular cycle - Lengthy discussion had with patient again regarding all management options for her abnormal uterine bleeding including: tranexamic acid (Lysteda), oral progesterone, Depo Provera, Levonogestrel IUD, endometrial ablation or hysterectomy as definitive surgical management.  Briefly discussed risks and benefits of each method.    Infertility - patient with h/o infertility. Notes that she has seen several REI doctors who all note she should consider REI. Has never tried any other interventions. Notes that her partner has had semen analyses noting low sperm count. He has never tried any medications to increase count. I discussed with patient that although IVF would likely be the most successful, it is also the most expensive. I assessed patient's desires to attempt other forms of reproductive assistance, vs proceeding with surgical intervention of which it would not be recommended that she consider pregnancy after this. Patient notes she is now unsure at this time. Would like to discuss with her partner. Offered trial of Lysteda for 1 month to help manage her cycle until she makes a decision. Patient ok to try.  Leiomyoma - 2 small  fibroids noted. Not likely cause of menorrhagia (or infertility).    A total of 25 minutes were spent during this encounter, including review of previous progress notes, recent imaging and labs, face-to-face with time with patient involving counseling and coordination of care, as well as documentation for current visit.   Rubie Maid, MD [facility]

## 2022-07-10 ENCOUNTER — Ambulatory Visit: Payer: BC Managed Care – PPO | Admitting: Obstetrics and Gynecology

## 2022-07-10 VITALS — BP 156/62 | HR 44 | Resp 16 | Ht 64.0 in | Wt 188.8 lb

## 2022-07-10 DIAGNOSIS — N920 Excessive and frequent menstruation with regular cycle: Secondary | ICD-10-CM

## 2022-07-10 DIAGNOSIS — Z319 Encounter for procreative management, unspecified: Secondary | ICD-10-CM | POA: Diagnosis not present

## 2022-07-10 DIAGNOSIS — D219 Benign neoplasm of connective and other soft tissue, unspecified: Secondary | ICD-10-CM

## 2022-07-10 MED ORDER — TRANEXAMIC ACID 650 MG PO TABS: 1300.0000 mg | ORAL_TABLET | Freq: Three times a day (TID) | ORAL | 2 refills | Status: DC

## 2022-07-11 ENCOUNTER — Encounter: Payer: Self-pay | Admitting: Obstetrics and Gynecology

## 2022-07-12 IMAGING — MG MM DIGITAL SCREENING BILAT W/ TOMO AND CAD
8 series · 8 of 24 positions shown · non-contrast
Comparison: None.

CLINICAL DATA: Screening.

EXAM:
DIGITAL SCREENING BILATERAL MAMMOGRAM WITH TOMOSYNTHESIS AND CAD
TECHNIQUE: Bilateral screening digital craniocaudal and mediolateral oblique
mammograms were obtained. Bilateral screening digital breast
tomosynthesis was performed. The images were evaluated with
computer-aided detection.

[L MLO synth-2D]
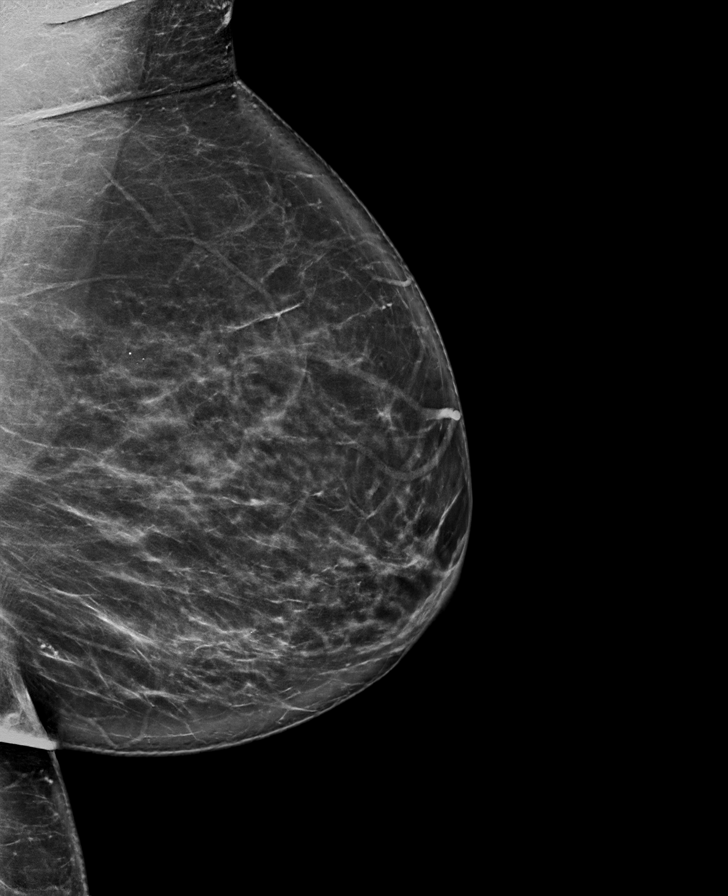

[R MLO synth-2D]
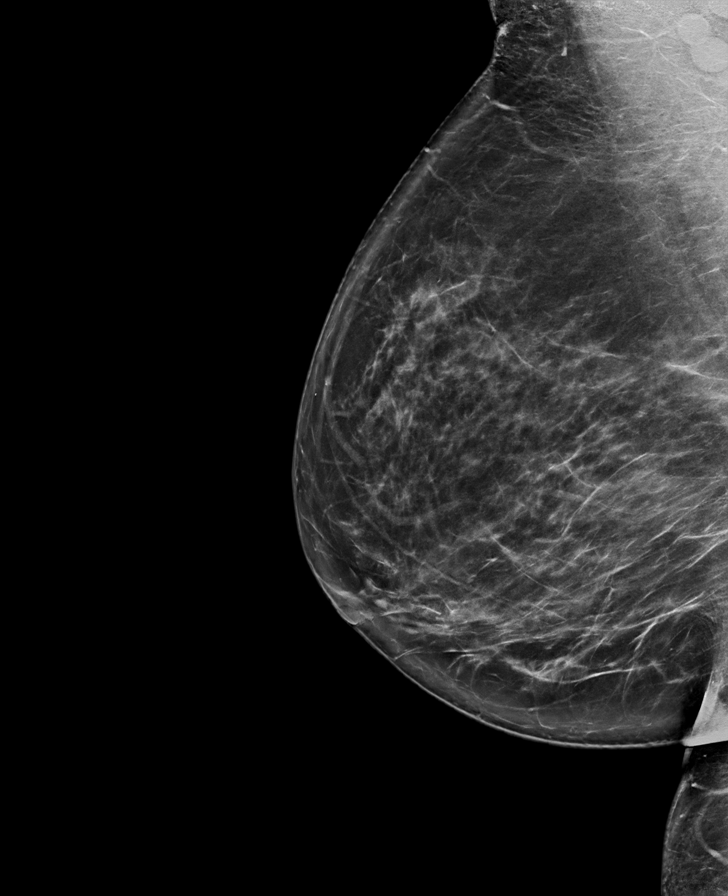

[R CC synth-2D]
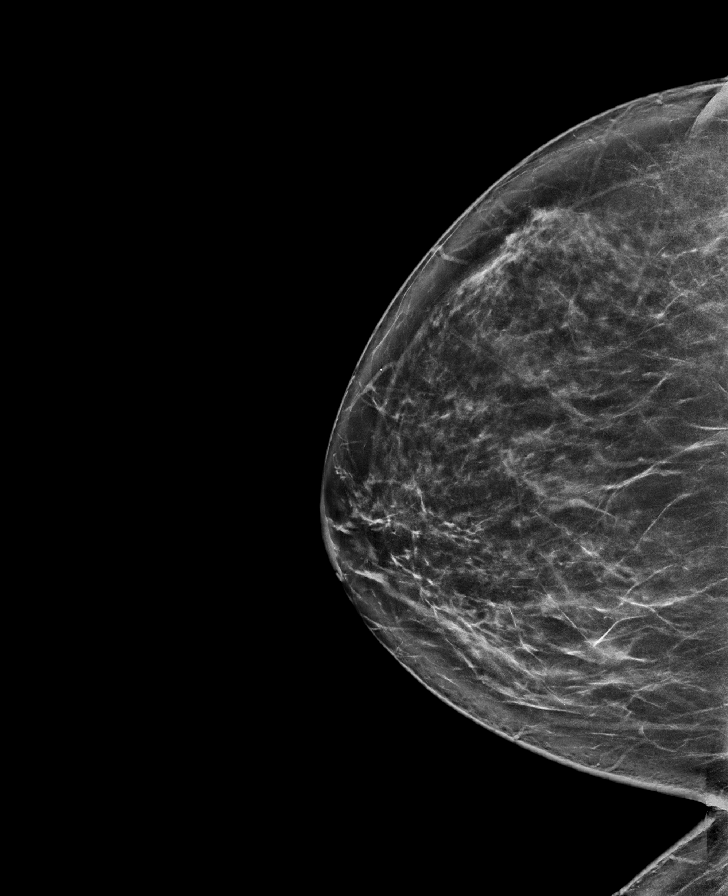

[L CC synth-2D]
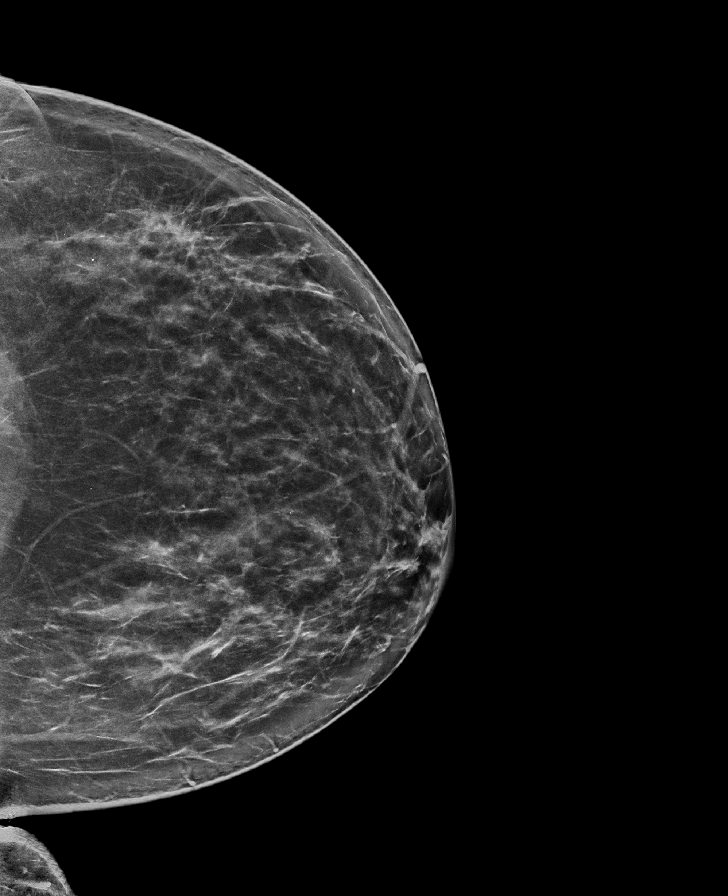

[R CC tomo · tomo slice 45/88.0]
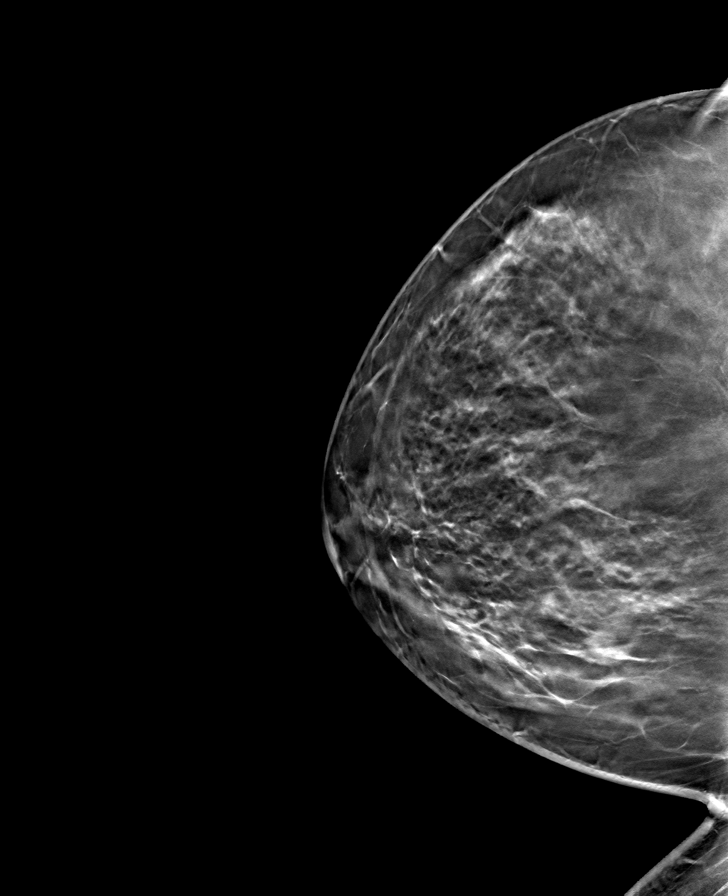

[L MLO tomo · tomo slice 47/94.0]
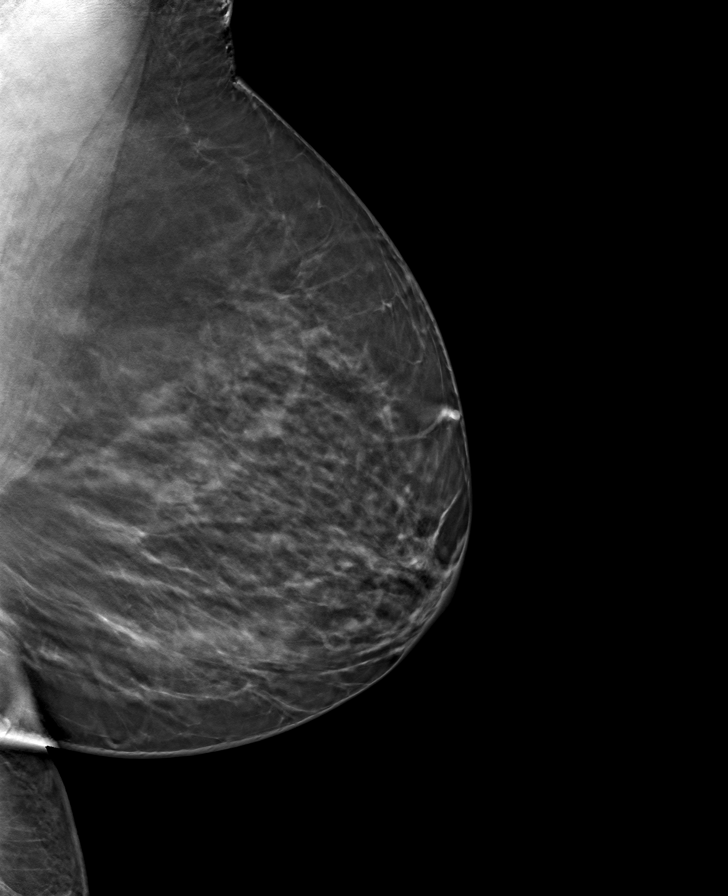

[R MLO tomo · tomo slice 47/92.0]
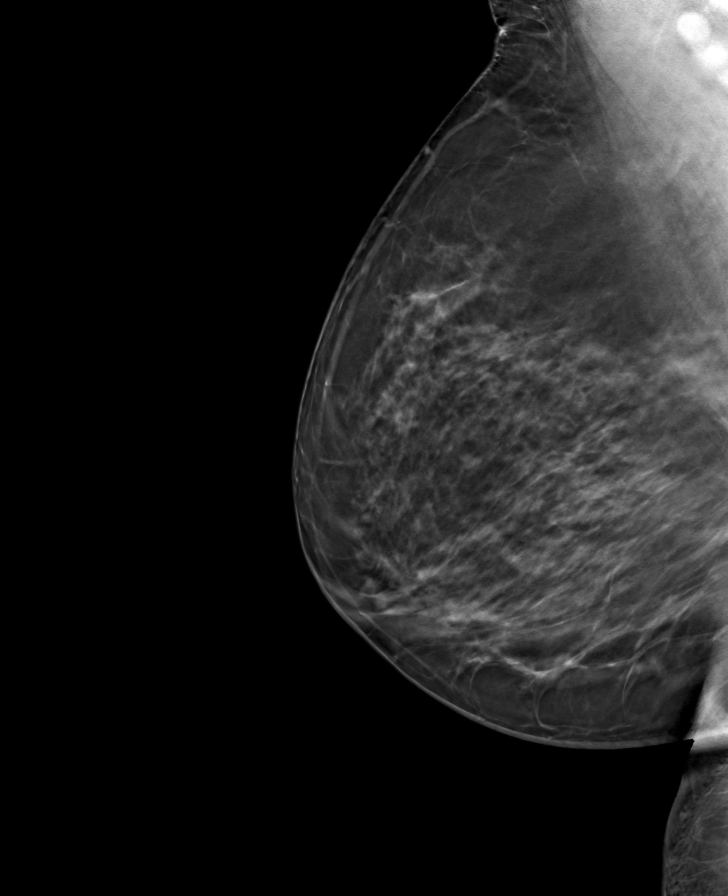

[L CC tomo · tomo slice 45/88.0]
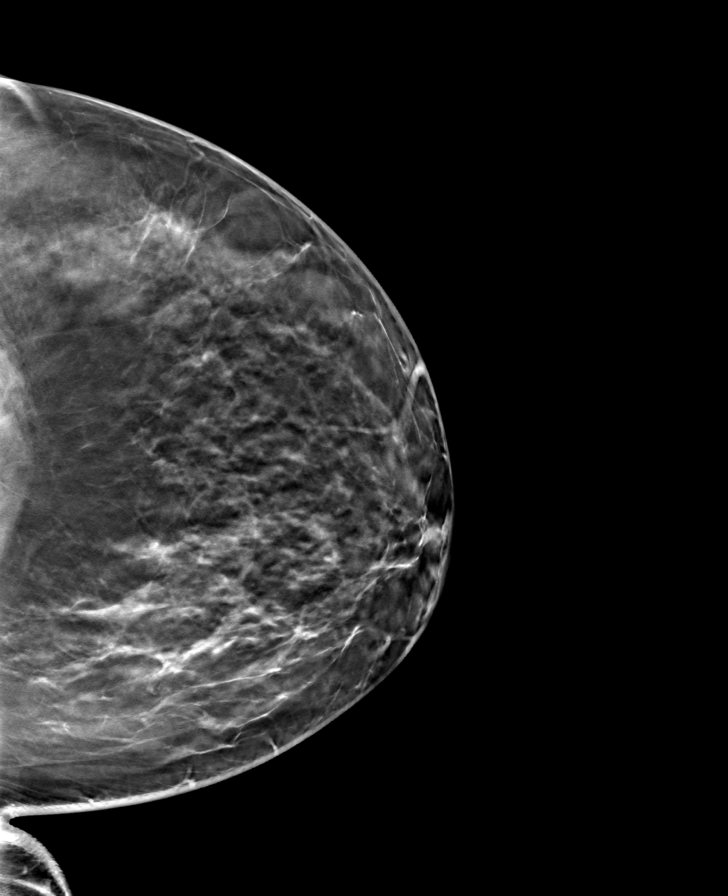

[8 of 24 positions shown; findings below may reference images not displayed]

ACR Breast Density Category b: There are scattered areas of
fibroglandular density.
FINDINGS: There are no findings suspicious for malignancy.
IMPRESSION: No mammographic evidence of malignancy. A result letter of this
screening mammogram will be mailed directly to the patient.

RECOMMENDATION:
Screening mammogram in one year. (Code:XG-X-X7B)

BI-RADS CATEGORY  1: Negative.

## 2022-08-07 ENCOUNTER — Ambulatory Visit: Payer: BC Managed Care – PPO | Admitting: Obstetrics and Gynecology

## 2022-08-07 ENCOUNTER — Encounter: Payer: Self-pay | Admitting: Obstetrics and Gynecology

## 2022-08-07 VITALS — BP 150/81 | HR 102 | Ht 64.0 in | Wt 185.8 lb

## 2022-08-07 DIAGNOSIS — D219 Benign neoplasm of connective and other soft tissue, unspecified: Secondary | ICD-10-CM | POA: Diagnosis not present

## 2022-08-07 DIAGNOSIS — N920 Excessive and frequent menstruation with regular cycle: Secondary | ICD-10-CM | POA: Diagnosis not present

## 2022-08-07 NOTE — Progress Notes (Unsigned)
    GYNECOLOGY PROGRESS NOTE  Subjective:    Patient ID: Alison Frazier, female    DOB: 27-Feb-1981, 41 y.o.   MRN: [id number]  HPI  Patient is a 41 y.o. G0P0000 female who presents for menorrhagia   The following portions of the patient's history were reviewed and updated as appropriate: allergies, current medications, past family history, past medical history, past social history, past surgical history, and problem list.  Review of Systems Pertinent items are noted in HPI.   Objective:   Last menstrual period 06/27/2022. There is no height or weight on file to calculate BMI. General appearance: alert, cooperative, and no distress Abdomen: {abdominal exam:16834} Pelvic: {pelvic exam:16852::"cervix normal in appearance","external genitalia normal","no adnexal masses or tenderness","no cervical motion tenderness","rectovaginal septum normal","uterus normal size, shape, and consistency","vagina normal without discharge"} Extremities: {extremity exam:5109} Neurologic: {neuro exam:17854}   Assessment:   No diagnosis found.   Plan:   There are no diagnoses linked to this encounter.

## 2022-08-07 NOTE — Progress Notes (Incomplete)
    GYNECOLOGY PROGRESS NOTE  Subjective:    Patient ID: Alison Frazier, female    DOB: 1980-11-13, 41 y.o.   MRN: [id number]  HPI  Patient is a 41 y.o. G0P0000 female who presents for follow up for management of menorrhagia. Patient initially presented for discussion of surgical management with endometrial ablation last month, but after discussion of desires for fertility, patient desired 1 month to think about it. Notes that she is now sure of her decision, as she has been dealing with issues of infertility for several years and now her husband has recently had some health issues (initiated dialysis last week).    The following portions of the patient's history were reviewed and updated as appropriate: allergies, current medications, past family history, past medical history, past social history, past surgical history, and problem list.  Review of Systems Pertinent items are noted in HPI.   Objective:   Blood pressure (!) 150/81, pulse (!) 102, height '5\' 4"'$  (1.626 m), weight 185 lb 12.8 oz (84.3 kg), last menstrual period 06/27/2022.  Body mass index is 31.89 kg/m. General appearance: alert, cooperative, and no distress See H&P for remainder of exam.    Assessment:   1. Menorrhagia with regular cycle   2. Leiomyoma      Plan:   Patient desires surgical management with endometrial ablation.  Is certain at this time that she does not desire childbearing due to "many other issues". Also discussed other alternatives for expanding her family including adoption and surrogacy.  The risks of surgery were discussed in detail with the patient including but not limited to: bleeding which may require transfusion or reoperation; infection which may require prolonged hospitalization or re-hospitalization and antibiotic therapy; injury to bowel, bladder, ureters and major vessels or other surrounding organs which may lead to other procedures; formation of adhesions; need for additional procedures  including laparotomy or subsequent procedures secondary to intraoperative injury or abnormal pathology; thromboembolic phenomenon; incisional problems and other postoperative or anesthesia complications.  Patient was told that the likelihood that her condition and symptoms will be treated effectively with this surgical management was very high; the postoperative expectations were also discussed in detail. The patient also understands the alternative treatment options which were discussed in full. All questions were answered.  Surgery to be scheduled for 08/28/2022; routine preoperative instructions will be given to her by the preoperative nursing team. Printed patient education handouts about the procedure were given to the patient to review at home. - Discussed that uterine fibroids were small and likely would not cause any issues after her ablation.    A total of 25 minutes were spent during this encounter, including review of previous progress notes, recent imaging and labs, face-to-face with time with patient involving counseling and coordination of care, as well as documentation for current visit.

## 2022-08-07 NOTE — Patient Instructions (Signed)
GYNECOLOGY PRE-OPERATIVE INSTRUCTIONS  You are scheduled for surgery on 08/28/2022.  The name of your procedure is: Hysteroscopy Dilation and Curretage with Endometrial Ablation.   Please read through these instructions carefully regarding preparation for your surgery: Nothing to eat after midnight on the day prior to surgery.  Do not take any medications unless recommended by your provider on day prior to surgery.  Do not take NSAIDs (Motrin, Aleve) or aspirin 7 days prior to surgery.  You may take Tylenol products for minor aches and pains.  You will receive a prescription for pain medications post-operatively.  You will be contacted by phone approximately 1-2 weeks prior to surgery to schedule your pre-operative appointment.  If you are being admitted to the hospital for an overnight stay, you will require COVID testing prior to surgery. Instructions will be given to you on when and where to have this performed.  Please call the office if you have any questions regarding your upcoming surgery.    Thank you for choosing [facility] at [facility].

## 2022-08-08 ENCOUNTER — Encounter: Payer: Self-pay | Admitting: Obstetrics and Gynecology

## 2022-08-08 NOTE — H&P (View-Only) (Signed)
GYNECOLOGY PREOPERATIVE HISTORY AND PHYSICAL   Subjective:  Alison Frazier is a 41 y.o. G0P0000 here for surgical management of dysfunctional uterine bleeding (menorrhagia) and dysmenorrhea. Cycles are 5-6 days and occur monthly. Reports using a super tampon every hour throughout cycle.  Has noted heavy menstrual cycles since onset of menarche. Patient also with a h/o small fibroids.  No significant preoperative concerns.   Proposed surgery: Hysteroscopy D&C with endometrial ablation   Pertinent Gynecological History: Bleeding: heavy menstrual cycles Contraception: none Last mammogram: normal Date: 08/06/2021 Last pap: abnormal: ASCUS HR HPV neg  Date: 02/04/2021   Past Medical History:  Diagnosis Date   Asthma    inhalers in the past   Chlamydia    Dysrhythmia    PVC's   Family history of adverse reaction to anesthesia    mother died during hip fracture surgery   Family history of colon cancer in father    GERD (gastroesophageal reflux disease)    not recent   Leiomyoma    Ovarian cyst     Past Surgical History:  Procedure Laterality Date   APPENDECTOMY     CHROMOPERTUBATION N/A 03/14/2020   Procedure: CHROMOPERTUBATION;  Surgeon: Gae Dry, MD;  Location: [facility];  Service: Gynecology;  Laterality: N/A;   DILATION AND CURETTAGE OF UTERUS     dnc     INFERIOR OBLIQUE MYECTOMY     LAPAROSCOPIC LYSIS OF ADHESIONS  03/14/2020   Procedure: LAPAROSCOPIC LYSIS OF ADHESIONS;  Surgeon: Gae Dry, MD;  Location: [facility];  Service: Gynecology;;   LAPAROSCOPIC UNILATERAL SALPINGO OOPHERECTOMY N/A 03/14/2020   Procedure: OPERATIVE LAPAROSCOPY RIGHT SALPINGECTOMY AND CHROMOPERTUBATION;  Surgeon: Gae Dry, MD;  Location: [facility];  Service: Gynecology;  Laterality: N/A;   LAPAROSCOPY N/A 03/17/2020   Procedure: ROBOTIC ASSISTED LAPAROSCOPY, DIAGNOSTIC;  Surgeon: Ronny Bacon, MD;  Location: [facility];  Service: General;  Laterality: N/A;   OVARIAN CYST  REMOVAL     SMALL BOWEL REPAIR N/A 03/17/2020   Procedure: SMALL BOWEL REPAIR, laparoscopic robotic assisted.;  Surgeon: Ronny Bacon, MD;  Location: [facility];  Service: General;  Laterality: N/A;    OB History  Gravida Para Term Preterm AB Living  0 0 0 0 0 0  SAB IAB Ectopic Multiple Live Births  0 0 0 0 0    Family History  Problem Relation Age of Onset   Stroke Mother    Diabetes Father    Hypertension Father    Colon cancer Father    Throat cancer Father    Breast cancer Neg Hx    Ovarian cancer Neg Hx     Social History   Socioeconomic History   Marital status: Married    Spouse name: Not on file   Number of children: Not on file   Years of education: Not on file   Highest education level: Not on file  Occupational History   Not on file  Tobacco Use   Smoking status: Never   Smokeless tobacco: Never  Vaping Use   Vaping Use: Never used  Substance and Sexual Activity   Alcohol use: Not Currently   Drug use: Not Currently   Sexual activity: Not Currently    Birth control/protection: None  Other Topics Concern   Not on file  Social History Narrative   Not on file   Social Determinants of Health   Financial Resource Strain: Not on file  Food Insecurity: Not on file  Transportation Needs: Not on file  Physical Activity: Not on file  Stress: Not on file  Social Connections: Not on file  Intimate Partner Violence: Not on file    Current Outpatient Medications on File Prior to Visit  Medication Sig Dispense Refill   albuterol (VENTOLIN HFA) 108 (90 Base) MCG/ACT inhaler INHALE 2 INHALATIONS INTO THE LUNGS EVERY 6 HOURS AS NEEDED FOR WHEEZING     carvedilol (COREG) 3.125 MG tablet TAKE 1 TABLET(3.125 MG) BY MOUTH TWICE DAILY WITH MEALS     losartan (COZAAR) 25 MG tablet Take 1 tablet by mouth daily.     No current facility-administered medications on file prior to visit.    Allergies  Allergen Reactions   Contrast Media [Iodinated Contrast Media]  Anaphylaxis   Iodine Anaphylaxis   Shellfish-Derived Products Anaphylaxis   Other Other (See Comments)    Pollen  Hives itching  Nasal congestion Plumeria  (flower) causes hives and itching    Review of Systems Constitutional: No recent fever/chills/sweats Respiratory: No recent cough/bronchitis Cardiovascular: No chest pain Gastrointestinal: No recent nausea/vomiting/diarrhea Genitourinary: No UTI symptoms Hematologic/lymphatic:No history of coagulopathy or recent blood thinner use    Objective:   Blood pressure (!) 150/81, pulse (!) 102, height '5\' 4"'$  (1.626 m), weight 185 lb 12.8 oz (84.3 kg), last menstrual period 06/27/2022. CONSTITUTIONAL: Well-developed, well-nourished female in no acute distress.  HENT:  Normocephalic, atraumatic, External right and left ear normal. Oropharynx is clear and moist EYES: Conjunctivae and EOM are normal. Pupils are equal, round, and reactive to light. No scleral icterus.  NECK: Normal range of motion, supple, no masses SKIN: Skin is warm and dry. No rash noted. Not diaphoretic. No erythema. No pallor. NEUROLOGIC: Alert and oriented to person, place, and time. Normal reflexes, muscle tone coordination. No cranial nerve deficit noted. PSYCHIATRIC: Normal mood and affect. Normal behavior. Normal judgment and thought content. CARDIOVASCULAR: Normal heart rate noted, regular rhythm RESPIRATORY: Effort and breath sounds normal, no problems with respiration noted ABDOMEN: Soft, nontender, nondistended. PELVIC: Deferred MUSCULOSKELETAL: Normal range of motion. No edema and no tenderness. 2+ distal pulses.    Labs: Lab Results  Component Value Date   WBC 4.6 01/30/2022   HGB 12.7 01/30/2022   HCT 39.9 01/30/2022   MCV 81.3 01/30/2022   PLT 365 01/30/2022     Imaging Studies: Narrative & Impression  CLINICAL DATA:  Menorrhagia   Abnormal uterine bleeding   Concern for fibroids   EXAM: TRANSABDOMINAL AND TRANSVAGINAL ULTRASOUND OF  PELVIS   TECHNIQUE: Both transabdominal and transvaginal ultrasound examinations of the pelvis were performed. Transabdominal technique was performed for global imaging of the pelvis including uterus, ovaries, adnexal regions, and pelvic cul-de-sac. It was necessary to proceed with endovaginal exam following the transabdominal exam to visualize the uterus, endometrium, ovaries, and adnexa.   COMPARISON:  01/10/2020   FINDINGS: Uterus   Measurements: 7.1 x 4.2 x 4.7 cm = volume: 74 mL. 1.1 cm fibroid noted in the posterior right uterine body. 1.9 cm fibroid noted in the left uterine fundus.   Endometrium   Thickness: 11 mm.  No focal abnormality visualized.   Right ovary   Measurements: 4.0 x 2.6 x 2.7 cm = volume: 14 mL. Normal appearance.   Left ovary   Measurements: 4.1 x 2.3 x 2.1 cm = volume: 10 mL. Normal appearance.   Other findings   No abnormal free fluid.   IMPRESSION: 1. Two small uterine fibroids. 2. If bleeding remains unresponsive to hormonal or medical therapy, sonohysterogram should be considered for  focal lesion work-up. (Ref: Radiological Reasoning: Algorithmic Workup of Abnormal Vaginal Bleeding with Endovaginal Sonography and Sonohysterography. AJR 2008; 867:Y19-50)     Electronically Signed   By: Miachel Roux M.D.   On: 11/26/2021 17:08    Assessment:    1. Menorrhagia with regular cycle   2. Leiomyoma     Plan:   - Counseling: Procedure, risks, reasons, benefits and complications (including injury to bowel, bladder, major blood vessel, ureter, bleeding, possibility of transfusion, infection, or fistula formation) reviewed in detail. Likelihood of success in alleviating the patient's condition was discussed. Routine postoperative instructions will be reviewed with the patient and her family in detail after surgery.  The patient concurred with the proposed plan, giving informed written consent for the surgery.   - Preop testing ordered. -  Instructions reviewed, including NPO after midnight.     Rubie Maid, MD [facility] at [facility]

## 2022-08-08 NOTE — H&P (Signed)
GYNECOLOGY PREOPERATIVE HISTORY AND PHYSICAL   Subjective:  Alison Frazier is a 41 y.o. G0P0000 here for surgical management of dysfunctional uterine bleeding (menorrhagia) and dysmenorrhea. Cycles are 5-6 days and occur monthly. Reports using a super tampon every hour throughout cycle.  Has noted heavy menstrual cycles since onset of menarche. Patient also with a h/o small fibroids.  No significant preoperative concerns.   Proposed surgery: Hysteroscopy D&C with endometrial ablation   Pertinent Gynecological History: Bleeding: heavy menstrual cycles Contraception: none Last mammogram: normal Date: 08/06/2021 Last pap: abnormal: ASCUS HR HPV neg  Date: 02/04/2021   Past Medical History:  Diagnosis Date   Asthma    inhalers in the past   Chlamydia    Dysrhythmia    PVC's   Family history of adverse reaction to anesthesia    mother died during hip fracture surgery   Family history of colon cancer in father    GERD (gastroesophageal reflux disease)    not recent   Leiomyoma    Ovarian cyst     Past Surgical History:  Procedure Laterality Date   APPENDECTOMY     CHROMOPERTUBATION N/A 03/14/2020   Procedure: CHROMOPERTUBATION;  Surgeon: Gae Dry, MD;  Location: [facility];  Service: Gynecology;  Laterality: N/A;   DILATION AND CURETTAGE OF UTERUS     dnc     INFERIOR OBLIQUE MYECTOMY     LAPAROSCOPIC LYSIS OF ADHESIONS  03/14/2020   Procedure: LAPAROSCOPIC LYSIS OF ADHESIONS;  Surgeon: Gae Dry, MD;  Location: [facility];  Service: Gynecology;;   LAPAROSCOPIC UNILATERAL SALPINGO OOPHERECTOMY N/A 03/14/2020   Procedure: OPERATIVE LAPAROSCOPY RIGHT SALPINGECTOMY AND CHROMOPERTUBATION;  Surgeon: Gae Dry, MD;  Location: [facility];  Service: Gynecology;  Laterality: N/A;   LAPAROSCOPY N/A 03/17/2020   Procedure: ROBOTIC ASSISTED LAPAROSCOPY, DIAGNOSTIC;  Surgeon: Ronny Bacon, MD;  Location: [facility];  Service: General;  Laterality: N/A;   OVARIAN CYST  REMOVAL     SMALL BOWEL REPAIR N/A 03/17/2020   Procedure: SMALL BOWEL REPAIR, laparoscopic robotic assisted.;  Surgeon: Ronny Bacon, MD;  Location: [facility];  Service: General;  Laterality: N/A;    OB History  Gravida Para Term Preterm AB Living  0 0 0 0 0 0  SAB IAB Ectopic Multiple Live Births  0 0 0 0 0    Family History  Problem Relation Age of Onset   Stroke Mother    Diabetes Father    Hypertension Father    Colon cancer Father    Throat cancer Father    Breast cancer Neg Hx    Ovarian cancer Neg Hx     Social History   Socioeconomic History   Marital status: Married    Spouse name: Not on file   Number of children: Not on file   Years of education: Not on file   Highest education level: Not on file  Occupational History   Not on file  Tobacco Use   Smoking status: Never   Smokeless tobacco: Never  Vaping Use   Vaping Use: Never used  Substance and Sexual Activity   Alcohol use: Not Currently   Drug use: Not Currently   Sexual activity: Not Currently    Birth control/protection: None  Other Topics Concern   Not on file  Social History Narrative   Not on file   Social Determinants of Health   Financial Resource Strain: Not on file  Food Insecurity: Not on file  Transportation Needs: Not on file  Physical Activity: Not on file  Stress: Not on file  Social Connections: Not on file  Intimate Partner Violence: Not on file    Current Outpatient Medications on File Prior to Visit  Medication Sig Dispense Refill   albuterol (VENTOLIN HFA) 108 (90 Base) MCG/ACT inhaler INHALE 2 INHALATIONS INTO THE LUNGS EVERY 6 HOURS AS NEEDED FOR WHEEZING     carvedilol (COREG) 3.125 MG tablet TAKE 1 TABLET(3.125 MG) BY MOUTH TWICE DAILY WITH MEALS     losartan (COZAAR) 25 MG tablet Take 1 tablet by mouth daily.     No current facility-administered medications on file prior to visit.    Allergies  Allergen Reactions   Contrast Media [Iodinated Contrast Media]  Anaphylaxis   Iodine Anaphylaxis   Shellfish-Derived Products Anaphylaxis   Other Other (See Comments)    Pollen  Hives itching  Nasal congestion Plumeria  (flower) causes hives and itching    Review of Systems Constitutional: No recent fever/chills/sweats Respiratory: No recent cough/bronchitis Cardiovascular: No chest pain Gastrointestinal: No recent nausea/vomiting/diarrhea Genitourinary: No UTI symptoms Hematologic/lymphatic:No history of coagulopathy or recent blood thinner use    Objective:   Blood pressure (!) 150/81, pulse (!) 102, height '5\' 4"'$  (1.626 m), weight 185 lb 12.8 oz (84.3 kg), last menstrual period 06/27/2022. CONSTITUTIONAL: Well-developed, well-nourished female in no acute distress.  HENT:  Normocephalic, atraumatic, External right and left ear normal. Oropharynx is clear and moist EYES: Conjunctivae and EOM are normal. Pupils are equal, round, and reactive to light. No scleral icterus.  NECK: Normal range of motion, supple, no masses SKIN: Skin is warm and dry. No rash noted. Not diaphoretic. No erythema. No pallor. NEUROLOGIC: Alert and oriented to person, place, and time. Normal reflexes, muscle tone coordination. No cranial nerve deficit noted. PSYCHIATRIC: Normal mood and affect. Normal behavior. Normal judgment and thought content. CARDIOVASCULAR: Normal heart rate noted, regular rhythm RESPIRATORY: Effort and breath sounds normal, no problems with respiration noted ABDOMEN: Soft, nontender, nondistended. PELVIC: Deferred MUSCULOSKELETAL: Normal range of motion. No edema and no tenderness. 2+ distal pulses.    Labs: Lab Results  Component Value Date   WBC 4.6 01/30/2022   HGB 12.7 01/30/2022   HCT 39.9 01/30/2022   MCV 81.3 01/30/2022   PLT 365 01/30/2022     Imaging Studies: Narrative & Impression  CLINICAL DATA:  Menorrhagia   Abnormal uterine bleeding   Concern for fibroids   EXAM: TRANSABDOMINAL AND TRANSVAGINAL ULTRASOUND OF  PELVIS   TECHNIQUE: Both transabdominal and transvaginal ultrasound examinations of the pelvis were performed. Transabdominal technique was performed for global imaging of the pelvis including uterus, ovaries, adnexal regions, and pelvic cul-de-sac. It was necessary to proceed with endovaginal exam following the transabdominal exam to visualize the uterus, endometrium, ovaries, and adnexa.   COMPARISON:  01/10/2020   FINDINGS: Uterus   Measurements: 7.1 x 4.2 x 4.7 cm = volume: 74 mL. 1.1 cm fibroid noted in the posterior right uterine body. 1.9 cm fibroid noted in the left uterine fundus.   Endometrium   Thickness: 11 mm.  No focal abnormality visualized.   Right ovary   Measurements: 4.0 x 2.6 x 2.7 cm = volume: 14 mL. Normal appearance.   Left ovary   Measurements: 4.1 x 2.3 x 2.1 cm = volume: 10 mL. Normal appearance.   Other findings   No abnormal free fluid.   IMPRESSION: 1. Two small uterine fibroids. 2. If bleeding remains unresponsive to hormonal or medical therapy, sonohysterogram should be considered for  focal lesion work-up. (Ref: Radiological Reasoning: Algorithmic Workup of Abnormal Vaginal Bleeding with Endovaginal Sonography and Sonohysterography. AJR 2008; 638:G53-64)     Electronically Signed   By: Miachel Roux M.D.   On: 11/26/2021 17:08    Assessment:    1. Menorrhagia with regular cycle   2. Leiomyoma     Plan:   - Counseling: Procedure, risks, reasons, benefits and complications (including injury to bowel, bladder, major blood vessel, ureter, bleeding, possibility of transfusion, infection, or fistula formation) reviewed in detail. Likelihood of success in alleviating the patient's condition was discussed. Routine postoperative instructions will be reviewed with the patient and her family in detail after surgery.  The patient concurred with the proposed plan, giving informed written consent for the surgery.   - Preop testing ordered. -  Instructions reviewed, including NPO after midnight.     Rubie Maid, MD [facility] at [facility]

## 2022-08-13 ENCOUNTER — Other Ambulatory Visit: Payer: Self-pay | Admitting: Internal Medicine

## 2022-08-13 DIAGNOSIS — Z1231 Encounter for screening mammogram for malignant neoplasm of breast: Secondary | ICD-10-CM

## 2022-08-17 ENCOUNTER — Encounter
Admission: RE | Admit: 2022-08-17 | Discharge: 2022-08-17 | Disposition: A | Discharge status: Home / Self Care | Payer: BC Managed Care – PPO | Source: Ambulatory Visit | Attending: Obstetrics and Gynecology | Admitting: Obstetrics and Gynecology

## 2022-08-17 VITALS — Ht 64.0 in | Wt 185.8 lb

## 2022-08-17 DIAGNOSIS — Z01818 Encounter for other preprocedural examination: Secondary | ICD-10-CM

## 2022-08-17 HISTORY — DX: Ileus, unspecified: K56.7

## 2022-08-17 HISTORY — DX: Cervical high risk human papillomavirus (HPV) DNA test positive: R87.810

## 2022-08-17 HISTORY — DX: Essential (primary) hypertension: I10

## 2022-08-17 HISTORY — DX: Other chest pain: R07.89

## 2022-08-17 HISTORY — DX: Cardiac murmur, unspecified: R01.1

## 2022-08-17 HISTORY — DX: Ventricular premature depolarization: I49.3

## 2022-08-17 NOTE — Patient Instructions (Addendum)
Your procedure is scheduled on:08-28-22 Friday Report to the Registration Desk on the 1st floor of the [facility].Then proceed to the 2nd floor Surgery Desk  To find out your arrival time, please call [phone] between 1PM - 3PM on:08-27-22 Thursday If your arrival time is 6:00 am, do not arrive prior to that time as the [facility] entrance doors do not open until 6:00 am.  REMEMBER: Instructions that are not followed completely may result in serious medical risk, up to and including death; or upon the discretion of your surgeon and anesthesiologist your surgery may need to be rescheduled.  Do not eat food OR drink any liquids after midnight the night before surgery.  No gum chewing, lozengers or hard candies.  TAKE THESE MEDICATIONS THE MORNING OF SURGERY WITH A SIP OF WATER: -carvedilol (COREG)   Use your Albuterol Inhaler the day of surgery and bring your Albuterol Inhaler to the hospital  One week prior to surgery: Stop Anti-inflammatories (NSAIDS) such as Advil, Aleve, Ibuprofen, Motrin, Naproxen, Naprosyn and Aspirin based products such as Excedrin, Goodys Powder, BC Powder.You may however,  take Tylenol if needed for pain up until the day of surgery.  Stop ANY OVER THE COUNTER supplements/vitamins 7 days prior to surgery   No Alcohol for 24 hours before or after surgery.  No Smoking including e-cigarettes for 24 hours prior to surgery.  No chewable tobacco products for at least 6 hours prior to surgery.  No nicotine patches on the day of surgery.  Do not use any "recreational" drugs for at least a week prior to your surgery.  Please be advised that the combination of cocaine and anesthesia may have negative outcomes, up to and including death. If you test positive for cocaine, your surgery will be cancelled.  On the morning of surgery brush your teeth with toothpaste and water, you may rinse your mouth with mouthwash if you wish. Do not swallow any toothpaste or  mouthwash.  Do not wear jewelry, make-up, hairpins, clips or nail polish.  Do not wear lotions, powders, or perfumes.   Do not shave body from the neck down 48 hours prior to surgery just in case you cut yourself which could leave a site for infection.  Also, freshly shaved skin may become irritated if using the CHG soap.  Contact lenses, hearing aids and dentures may not be worn into surgery.  Do not bring valuables to the hospital. [facility] is not responsible for any missing/lost belongings or valuables.   Notify your doctor if there is any change in your medical condition (cold, fever, infection).  Wear comfortable clothing (specific to your surgery type) to the hospital.  After surgery, you can help prevent lung complications by doing breathing exercises.  Take deep breaths and cough every 1-2 hours. Your doctor may order a device called an Incentive Spirometer to help you take deep breaths. When coughing or sneezing, hold a pillow firmly against your incision with both hands. This is called "splinting." Doing this helps protect your incision. It also decreases belly discomfort.  If you are being admitted to the hospital overnight, leave your suitcase in the car. After surgery it may be brought to your room.  If you are being discharged the day of surgery, you will not be allowed to drive home. You will need a responsible adult (18 years or older) to drive you home and stay with you that night.   If you are taking public transportation, you will need to  have a responsible adult (18 years or older) with you. Please confirm with your physician that it is acceptable to use public transportation.   Please call the [facility] at [phone] if you have any questions about these instructions.  Surgery Visitation Policy:  Patients undergoing a surgery or procedure may have two family members or support persons with them as long as the person is not COVID-19  positive or experiencing its symptoms.   MASKING: Due to an increase in RSV rates and hospitalizations, starting Wednesday, Nov. 15, in patient care areas in which we serve newborns, infants and children, masks will be required for teammates and visitors.  Children ages 48 and under may not visit. This policy affects the following departments only:  [facility] Mother Baby Unit Newborn nursery/Special care nursery  Other areas: Masks continue to be strongly recommended for [place] teammates, visitors and patients in all other areas. Visitation is not restricted outside of the units listed above.

## 2022-08-25 ENCOUNTER — Encounter: Payer: Self-pay | Admitting: Obstetrics and Gynecology

## 2022-08-25 NOTE — Progress Notes (Signed)
Perioperative Services  Pre-Admission/Anesthesia Testing Clinical Review  Date: 08/25/22  Patient Demographics:  Name: Alison Frazier DOB:   [birth year] MRN:   [id number]  Planned Surgical Procedure(s):    Case: [id number] Date/Time: 08/28/22 0715   Procedure: DILATATION AND CURETTAGE/HYSTEROSCOPY WITH ENDOMETRIAL ABLATION   Anesthesia type: General   Pre-op diagnosis: Menorrhagia   Location: [facility] OR ROOM 02 / [place] [facility] FOR ANESTHESIA GROUP   Surgeons: Rubie Maid, MD   NOTE: Available PAT nursing documentation and vital signs have been reviewed. Clinical nursing staff has updated patient's PMH/PSHx, current medication list, and drug allergies/intolerances to ensure comprehensive history available to assist in medical decision making as it pertains to the aforementioned surgical procedure and anticipated anesthetic course. Extensive review of available clinical information performed. [place] PMH and PSHx updated with any diagnoses/procedures that  may have been inadvertently omitted during her intake with the pre-admission testing department's nursing staff.  Clinical Discussion:  Alison Frazier is a 41 y.o. female who is submitted for pre-surgical anesthesia review and clearance prior to her undergoing the above procedure. Patient has never been a smoker. Pertinent PMH includes: atypical chest pain, dysfunction, PVCs, cardiac murmur, HTN, OSAH (noncompliant with prescribed nocturnal PAP therapy), asthma, GERD (no daily Tx), leiomyoma, menorrhagia, anxiety.  Patient is followed by cardiology Clayborn Bigness, MD). She was last seen in the cardiology clinic on 10/22/2018; notes reviewed.  At the time of her clinic visit, patient reported a 36-monthhistory of intermittent retrosternal chest discomfort described as a "sharp aching" and "feeling overall".  Symptoms exacerbated by exertion and reported to completely resolve with rest.  Patient with associated intermittent episodes of dyspnea that she  described as feeling like "a rush of cold air going through her chest".  Patient denied any PND, orthopnea, palpitations, significant peripheral edema, or presyncope/syncope.  Patient also experiencing episodes of intermittent vertiginous symptoms; no orthostasis noted on exam.  Patient with a past medical history significant for cardiovascular diagnoses.  TTE performed on 05/08/2020 revealed a low normal left trickle systolic function with an EF of 50%. Left ventricular diastolic Doppler parameters consistent with abnormal relaxation (G1DD).  Left ventricle and left atrium mildly enlarged.  There was evidence of trivial tricuspid/pulmonary and mild mitral valve regurgitation.  All transvalvular gradients were noted to be normal body no evidence suggestive of valvular stenosis.  Coronary CTA was performed on 10/31/2021 revealing a coronary calcium score of 0.  There were no extracardiac findings with the exception of a 3 mm RIGHT middle lobe pulmonary nodule favored to represent subpleural left lobe.  Blood pressure well-controlled at 128/82 mmHg on currently prescribed beta-blocker (carvedilol) and ARB (losartan) therapies.  Patient is not on any type of lipid-lowering therapies for ASCVD prevention.  She is not diabetic.  Patient does have an OSAH diagnosis, however is reportedly noncompliant with prescribed nocturnal PAP therapy.  She is not diabetic. Functional capacity, as defined by DASI, is documented as being >/= 4 METS.  No changes were made to her medication regimen.  Patient follow-up with outpatient cardiology in 1 month or sooner if needed.  CJENETTA WEASEis scheduled for a DILATATION AND CURETTAGE/HYSTEROSCOPY WITH ENDOMETRIAL ABLATION on 08/28/2022 with Dr. ARubie Maid MD.  Given patient's past medical history significant for cardiovascular diagnoses, presurgical cardiac clearance was sought by the PAT team. Per cardiology, "this patient is optimized for surgery and may proceed with the  planned procedural course with a LOW risk of significant perioperative cardiovascular complications". In review of her medication  reconciliation, the patient is not noted to be taking any type of anticoagulation or antiplatelet therapies that would need to be held during her perioperative course.  Patient denies previous perioperative complications with anesthesia in the past.  Patient did want to make mention of the fact of a history of (+) early emergence in 3rd degree relative (maternal cousin).  In review of the available records, it is noted that patient underwent a general anesthetic course here at [facility] ([facility]) in 02/2020 without documented complications.      08/17/2022    4:00 PM 08/07/2022    3:59 PM 07/10/2022    4:10 PM  Vitals with BMI  Height _0  _1  _2   Weight 185 lbs 14 oz 185 lbs 13 oz 188 lbs 13 oz  BMI 31.89 83.41 96.22  Systolic  297 989  Diastolic  81 62  Pulse  211 44    Providers/Specialists:   NOTE: Primary physician provider listed below. Patient may have been seen by APP or partner within same practice.   PROVIDER ROLE / SPECIALTY LAST Alfredia Ferguson, MD OB/GYN (Surgeon) 08/07/2022  Mickel Crow, MD Primary Care Provider 07/17/2022  Katrine Coho, MD Cardiology 10/22/2021   Allergies:  Contrast media [iodinated contrast media], Iodine, Shellfish-derived products, and Other  Current Home Medications:   No current facility-administered medications for this encounter.    albuterol (VENTOLIN HFA) 108 (90 Base) MCG/ACT inhaler   carvedilol (COREG) 3.125 MG tablet   losartan (COZAAR) 25 MG tablet   History:   Past Medical History:  Diagnosis Date   Anxiety    Asthma    Atypical chest pain    a.) cCTA 11/03/2021: Ca score 0.   Cervical high risk HPV (human papillomavirus) test positive    Chlamydia    Family history of adverse reaction to anesthesia    a.) early emergence in 3rd degree  relative (maternal cousin)   Family history of colon cancer in father    GERD (gastroesophageal reflux disease)    Heart murmur    Hypertension    Ileus (HCC)    Leiomyoma    OSA on CPAP    a.) non-compliant with prescibed nocturnal PAP therapy   Ovarian cyst    PVC (premature ventricular contraction)    Past Surgical History:  Procedure Laterality Date   APPENDECTOMY     CHROMOPERTUBATION N/A 03/14/2020   Procedure: CHROMOPERTUBATION;  Surgeon: Gae Dry, MD;  Location: [facility] ORS;  Service: Gynecology;  Laterality: N/A;   DILATION AND CURETTAGE OF UTERUS     INFERIOR OBLIQUE MYECTOMY     LAPAROSCOPIC LYSIS OF ADHESIONS  03/14/2020   Procedure: LAPAROSCOPIC LYSIS OF ADHESIONS;  Surgeon: Gae Dry, MD;  Location: [facility] ORS;  Service: Gynecology;;   LAPAROSCOPIC UNILATERAL SALPINGO OOPHERECTOMY N/A 03/14/2020   Procedure: OPERATIVE LAPAROSCOPY RIGHT SALPINGECTOMY AND CHROMOPERTUBATION;  Surgeon: Gae Dry, MD;  Location: [facility] ORS;  Service: Gynecology;  Laterality: N/A;   LAPAROSCOPY N/A 03/17/2020   Procedure: ROBOTIC ASSISTED LAPAROSCOPY, DIAGNOSTIC;  Surgeon: Ronny Bacon, MD;  Location: [facility] ORS;  Service: General;  Laterality: N/A;   OVARIAN CYST REMOVAL     SMALL BOWEL REPAIR N/A 03/17/2020   Procedure: SMALL BOWEL REPAIR, laparoscopic robotic assisted.;  Surgeon: Ronny Bacon, MD;  Location: [facility] ORS;  Service: General;  Laterality: N/A;   Family History  Problem Relation Age of Onset   Stroke Mother    Diabetes Father  Hypertension Father    Colon cancer Father    Throat cancer Father    Breast cancer Neg Hx    Ovarian cancer Neg Hx    Social History   Tobacco Use   Smoking status: Never   Smokeless tobacco: Never  Vaping Use   Vaping Use: Never used  Substance Use Topics   Alcohol use: Not Currently   Drug use: Not Currently    Pertinent Clinical Results:  LABS: Labs reviewed: Acceptable for surgery.   Ref Range & Units  07/24/2022  WBC (White Blood Cell Count) 4.1 - 10.2 10^3/uL 4.4  RBC (Red Blood Cell Count) 4.04 - 5.48 10^6/uL 4.64  Hemoglobin 12.0 - 15.0 gm/dL 12.4  Hematocrit 35.0 - 47.0 % 37.9  MCV (Mean Corpuscular Volume) 80.0 - 100.0 fl 81.7  MCH (Mean Corpuscular Hemoglobin) 27.0 - 31.2 pg 26.7 Low   MCHC (Mean Corpuscular Hemoglobin Concentration) 32.0 - 36.0 gm/dL 32.7  Platelet Count 150 - 450 10^3/uL 349  RDW-CV (Red Cell Distribution Width) 11.6 - 14.8 % 12.3  MPV (Mean Platelet Volume) 9.4 - 12.4 fl 9.7  Neutrophils 1.50 - 7.80 10^3/uL 2.59  Lymphocytes 1.00 - 3.60 10^3/uL 1.30  Monocytes 0.00 - 1.50 10^3/uL 0.36  Eosinophils 0.00 - 0.55 10^3/uL 0.08  Basophils 0.00 - 0.09 10^3/uL 0.02  Neutrophil % 32.0 - 70.0 % 59.4  Lymphocyte % 10.0 - 50.0 % 29.8  Monocyte % 4.0 - 13.0 % 8.3  Eosinophil % 1.0 - 5.0 % 1.8  Basophil% 0.0 - 2.0 % 0.5  Immature Granulocyte % <=0.7 % 0.2  Immature Granulocyte Count <=0.06 10^3/L 0.01  Resulting Agency  [facility] - [facility]  Specimen Collected: 07/24/22 07:54   Performed by: [facility] Last Resulted: 07/24/22 08:43  Received From: [facility]  Result Received: 08/03/22 08:56      Ref Range & Units 07/24/2022  Glucose 70 - 110 mg/dL 96  Sodium 136 - 145 mmol/L 136  Potassium 3.6 - 5.1 mmol/L 4.1  Chloride 97 - 109 mmol/L 105  Carbon Dioxide (CO2) 22.0 - 32.0 mmol/L 22.5  Urea Nitrogen (BUN) 7 - 25 mg/dL 11  Creatinine 0.6 - 1.1 mg/dL 0.9  Glomerular Filtration Rate (eGFR) >60 mL/min/1.73sq m 81  Calcium 8.7 - 10.3 mg/dL 9.2  AST 8 - 39 U/L 12  ALT 5 - 38 U/L 11  Alk Phos (alkaline Phosphatase) 34 - 104 U/L 60  Albumin 3.5 - 4.8 g/dL 4.1  Bilirubin, Total 0.3 - 1.2 mg/dL 0.5  Protein, Total 6.1 - 7.9 g/dL 7.0  A/G Ratio 1.0 - 5.0 gm/dL 1.4  Resulting Agency  [facility] - [facility]  Specimen Collected: 07/24/22 07:54   Performed by: [facility] Last Resulted: 07/24/22 17:55   Received From: [facility]  Result Received: 08/03/22 08:56    ECG: Date: 01/30/2022 Time ECG obtained: 1159 AM Rate: 100 bpm Rhythm:  Sinus rhythm with frequent PVCs Axis (leads I and aVF): Normal Intervals: PR 150 ms. QRS 72 ms. QTc 492 ms. ST segment and T wave changes: No evidence of acute ST segment elevation or depression Comparison: Similar to previous tracing obtained on 01/03/2022   IMAGING / PROCEDURES: DIAGNOSTIC RADIOGRAPHS OF CHEST 2 VIEWS performed on 01/30/2022 The heart size and mediastinal contours are normal.  The lungs are clear.  There is no pleural effusion or pneumothorax.  No acute osseous findings are identified. No active cardiopulmonary process.  CT  CARDIAC SCORING performed on 10/31/2020 Normal coronary calcium score of 0. Patient is low risk for coronary events. CAC 0, CAC-DRS A0. No acute findings in the imaged extracardiac chest. 3 mm right middle lobe pulmonary nodule, favored to represent a subpleural lymph node. No follow-up needed if patient is low-risk. Non-contrast chest CT can be considered in 12 months if patient is high-risk. Continue heart healthy lifestyle and risk factor modification.  TRANSTHORACIC ECHOCARDIOGRAM performed on 05/08/2020 Low normal left ventricular systolic function with an EF of 50% Left ventricular diastolic Doppler parameters consistent with abnormal relaxation (G1DD). Mild LV and LA enlargement Normal right ventricular systolic function Trivial TR and PR Mild MR Normal transvalvular gradients; no valvular stenosis No pericardial effusion  Impression and Plan:  SHYASIA FUNCHES has been referred for pre-anesthesia review and clearance prior to her undergoing the planned anesthetic and procedural courses. Available labs, pertinent testing, and imaging results were personally reviewed by me. This patient has been appropriately cleared by cardiology with an overall LOW risk of significant  perioperative cardiovascular complications.  Based on clinical review performed today (08/25/22), barring any significant acute changes in the patient's overall condition, it is anticipated that she will be able to proceed with the planned surgical intervention. Any acute changes in clinical condition may necessitate her procedure being postponed and/or cancelled. Patient will meet with anesthesia team (MD and/or CRNA) on the day of her procedure for preoperative evaluation/assessment. Questions regarding anesthetic course will be fielded at that time.   Pre-surgical instructions were reviewed with the patient during her PAT appointment and questions were fielded by PAT clinical staff. Patient was advised that if any questions or concerns arise prior to her procedure then she should return a call to PAT and/or her surgeon's office to discuss.  Honor Loh, MSN, APRN, FNP-C, CEN [facility]  Peri-operative Services Nurse Practitioner Phone: [phone] Fax: [phone] 08/25/22 3:22 PM  NOTE: This note has been prepared using Dragon dictation software. Despite my best ability to proofread, there is always the potential that unintentional transcriptional errors may still occur from this process.

## 2022-08-28 ENCOUNTER — Ambulatory Visit
Admission: RE | Admit: 2022-08-28 | Discharge: 2022-08-28 | Disposition: A | Discharge status: Home / Self Care | Payer: BC Managed Care – PPO | Source: Ambulatory Visit | Attending: Obstetrics and Gynecology | Admitting: Obstetrics and Gynecology

## 2022-08-28 ENCOUNTER — Encounter
Admission: RE | Disposition: A | Discharge status: Home / Self Care | Payer: Self-pay | Source: Ambulatory Visit | Attending: Obstetrics and Gynecology

## 2022-08-28 ENCOUNTER — Other Ambulatory Visit: Payer: Self-pay

## 2022-08-28 ENCOUNTER — Encounter: Payer: Self-pay | Admitting: Obstetrics and Gynecology

## 2022-08-28 ENCOUNTER — Ambulatory Visit: Payer: BC Managed Care – PPO | Admitting: Urgent Care

## 2022-08-28 DIAGNOSIS — N920 Excessive and frequent menstruation with regular cycle: Secondary | ICD-10-CM | POA: Insufficient documentation

## 2022-08-28 DIAGNOSIS — K219 Gastro-esophageal reflux disease without esophagitis: Secondary | ICD-10-CM | POA: Insufficient documentation

## 2022-08-28 DIAGNOSIS — N938 Other specified abnormal uterine and vaginal bleeding: Secondary | ICD-10-CM | POA: Insufficient documentation

## 2022-08-28 DIAGNOSIS — R008 Other abnormalities of heart beat: Secondary | ICD-10-CM | POA: Insufficient documentation

## 2022-08-28 DIAGNOSIS — D219 Benign neoplasm of connective and other soft tissue, unspecified: Secondary | ICD-10-CM

## 2022-08-28 DIAGNOSIS — I10 Essential (primary) hypertension: Secondary | ICD-10-CM | POA: Diagnosis not present

## 2022-08-28 DIAGNOSIS — D251 Intramural leiomyoma of uterus: Secondary | ICD-10-CM | POA: Insufficient documentation

## 2022-08-28 DIAGNOSIS — J45909 Unspecified asthma, uncomplicated: Secondary | ICD-10-CM | POA: Insufficient documentation

## 2022-08-28 DIAGNOSIS — Z01818 Encounter for other preprocedural examination: Secondary | ICD-10-CM

## 2022-08-28 HISTORY — DX: Obstructive sleep apnea (adult) (pediatric): G47.33

## 2022-08-28 HISTORY — DX: Anxiety disorder, unspecified: F41.9

## 2022-08-28 HISTORY — PX: DILATATION AND CURETTAGE/HYSTEROSCOPY WITH MINERVA: SHX6851

## 2022-08-28 SURGERY — DILATATION AND CURETTAGE/HYSTEROSCOPY WITH MINERVA
Anesthesia: General

## 2022-08-28 MED ORDER — PHENYLEPHRINE 80 MCG/ML (10ML) SYRINGE FOR IV PUSH (FOR BLOOD PRESSURE SUPPORT)
PREFILLED_SYRINGE | INTRAVENOUS | Status: DC | PRN
  Administered 2022-08-28 (×2): 80 ug via INTRAVENOUS

## 2022-08-28 MED ORDER — SILVER NITRATE-POT NITRATE 75-25 % EX MISC
CUTANEOUS | Status: AC
  Filled 2022-08-28: qty 10

## 2022-08-28 MED ORDER — PROPOFOL 10 MG/ML IV BOLUS
INTRAVENOUS | Status: DC | PRN
  Administered 2022-08-28: 140 mg via INTRAVENOUS

## 2022-08-28 MED ORDER — CHLORHEXIDINE GLUCONATE 0.12 % MT SOLN: 15.0000 mL | Freq: Once | OROMUCOSAL | Status: AC

## 2022-08-28 MED ORDER — BUPIVACAINE HCL (PF) 0.5 % IJ SOLN
INTRAMUSCULAR | Status: DC | PRN
  Administered 2022-08-28: 10 mL

## 2022-08-28 MED ORDER — OXYCODONE HCL 5 MG PO TABS: 5.0000 mg | ORAL_TABLET | Freq: Once | ORAL | Status: AC | PRN

## 2022-08-28 MED ORDER — LABETALOL HCL 5 MG/ML IV SOLN
INTRAVENOUS | Status: AC
  Administered 2022-08-28: 10 mg via INTRAVENOUS
  Filled 2022-08-28: qty 4

## 2022-08-28 MED ORDER — FAMOTIDINE 20 MG PO TABS: 20.0000 mg | ORAL_TABLET | Freq: Once | ORAL | Status: AC

## 2022-08-28 MED ORDER — FENTANYL CITRATE (PF) 100 MCG/2ML IJ SOLN
25.0000 ug | INTRAMUSCULAR | Status: DC | PRN
  Administered 2022-08-28: 50 ug via INTRAVENOUS

## 2022-08-28 MED ORDER — FAMOTIDINE 20 MG PO TABS
ORAL_TABLET | ORAL | Status: AC
  Administered 2022-08-28: 20 mg via ORAL
  Filled 2022-08-28: qty 1

## 2022-08-28 MED ORDER — PROPOFOL 10 MG/ML IV BOLUS
INTRAVENOUS | Status: AC
  Filled 2022-08-28: qty 40

## 2022-08-28 MED ORDER — FENTANYL CITRATE (PF) 100 MCG/2ML IJ SOLN
INTRAMUSCULAR | Status: AC
  Administered 2022-08-28: 25 ug via INTRAVENOUS
  Filled 2022-08-28: qty 2

## 2022-08-28 MED ORDER — SODIUM CHLORIDE 0.9 % IR SOLN
Status: DC | PRN
  Administered 2022-08-28: 254 mL

## 2022-08-28 MED ORDER — CHLORHEXIDINE GLUCONATE 0.12 % MT SOLN
OROMUCOSAL | Status: AC
  Administered 2022-08-28: 15 mL via OROMUCOSAL
  Filled 2022-08-28: qty 15

## 2022-08-28 MED ORDER — OXYCODONE HCL 5 MG/5ML PO SOLN: 5.0000 mg | Freq: Once | ORAL | Status: AC | PRN

## 2022-08-28 MED ORDER — KETOROLAC TROMETHAMINE 30 MG/ML IJ SOLN
INTRAMUSCULAR | Status: DC | PRN
  Administered 2022-08-28: 30 mg via INTRAVENOUS

## 2022-08-28 MED ORDER — BUPIVACAINE HCL (PF) 0.5 % IJ SOLN
INTRAMUSCULAR | Status: AC
  Filled 2022-08-28: qty 30

## 2022-08-28 MED ORDER — LACTATED RINGERS IV SOLN: INTRAVENOUS | Status: DC

## 2022-08-28 MED ORDER — ACETAMINOPHEN 500 MG PO TABS
ORAL_TABLET | ORAL | Status: AC
  Filled 2022-08-28: qty 2

## 2022-08-28 MED ORDER — MIDAZOLAM HCL 2 MG/2ML IJ SOLN
INTRAMUSCULAR | Status: AC
  Filled 2022-08-28: qty 2

## 2022-08-28 MED ORDER — OXYCODONE HCL 5 MG PO TABS
ORAL_TABLET | ORAL | Status: AC
  Administered 2022-08-28: 5 mg via ORAL
  Filled 2022-08-28: qty 1

## 2022-08-28 MED ORDER — ORAL CARE MOUTH RINSE: 15.0000 mL | Freq: Once | OROMUCOSAL | Status: AC

## 2022-08-28 MED ORDER — FENTANYL CITRATE (PF) 100 MCG/2ML IJ SOLN
INTRAMUSCULAR | Status: DC | PRN
  Administered 2022-08-28: 25 ug via INTRAVENOUS

## 2022-08-28 MED ORDER — ACETAMINOPHEN 500 MG PO TABS
1000.0000 mg | ORAL_TABLET | ORAL | Status: AC
  Administered 2022-08-28: 1000 mg via ORAL

## 2022-08-28 MED ORDER — ONDANSETRON HCL 4 MG/2ML IJ SOLN
INTRAMUSCULAR | Status: DC | PRN
  Administered 2022-08-28: 4 mg via INTRAVENOUS

## 2022-08-28 MED ORDER — DEXAMETHASONE SODIUM PHOSPHATE 10 MG/ML IJ SOLN
INTRAMUSCULAR | Status: DC | PRN
  Administered 2022-08-28: 10 mg via INTRAVENOUS

## 2022-08-28 MED ORDER — FENTANYL CITRATE (PF) 100 MCG/2ML IJ SOLN
INTRAMUSCULAR | Status: AC
  Filled 2022-08-28: qty 2

## 2022-08-28 MED ORDER — MIDAZOLAM HCL 2 MG/2ML IJ SOLN
INTRAMUSCULAR | Status: DC | PRN
  Administered 2022-08-28: 2 mg via INTRAVENOUS

## 2022-08-28 MED ORDER — LABETALOL HCL 5 MG/ML IV SOLN: 10.0000 mg | INTRAVENOUS | Status: DC | PRN

## 2022-08-28 MED ORDER — IBUPROFEN 600 MG PO TABS: 600.0000 mg | ORAL_TABLET | Freq: Four times a day (QID) | ORAL | 1 refills | Status: AC | PRN

## 2022-08-28 MED ORDER — LIDOCAINE HCL (CARDIAC) PF 100 MG/5ML IV SOSY
PREFILLED_SYRINGE | INTRAVENOUS | Status: DC | PRN
  Administered 2022-08-28: 80 mg via INTRAVENOUS

## 2022-08-28 MED ORDER — TRAMADOL HCL 50 MG PO TABS: 50.0000 mg | ORAL_TABLET | Freq: Four times a day (QID) | ORAL | 0 refills | Status: DC | PRN

## 2022-08-28 MED ORDER — ACETAMINOPHEN 500 MG PO TABS: 1000.0000 mg | ORAL_TABLET | Freq: Four times a day (QID) | ORAL | 0 refills | Status: DC | PRN

## 2022-08-28 MED ORDER — 0.9 % SODIUM CHLORIDE (POUR BTL) OPTIME
TOPICAL | Status: DC | PRN
  Administered 2022-08-28: 150 mL

## 2022-08-28 SURGICAL SUPPLY — 23 items
CANISTER SUC SOCK COL 7IN (MISCELLANEOUS) ×1 IMPLANT
DEVICE MYOSURE LITE (MISCELLANEOUS) IMPLANT
DRSG TELFA 3X8 NADH STRL (GAUZE/BANDAGES/DRESSINGS) IMPLANT
ELECT REM PT RETURN 9FT ADLT (ELECTROSURGICAL) ×1
ELECTRODE REM PT RTRN 9FT ADLT (ELECTROSURGICAL) ×1 IMPLANT
GLOVE BIO SURGEON STRL SZ 6.5 (GLOVE) ×1 IMPLANT
GOWN STRL REUS W/ TWL LRG LVL3 (GOWN DISPOSABLE) ×2 IMPLANT
GOWN STRL REUS W/TWL LRG LVL3 (GOWN DISPOSABLE) ×2
HANDPIECE ABLA MINERVA ENDO (MISCELLANEOUS) IMPLANT
IV NS IRRIG 3000ML ARTHROMATIC (IV SOLUTION) ×1 IMPLANT
KIT PROCEDURE FLUENT (KITS) IMPLANT
KIT TURNOVER CYSTO (KITS) ×1 IMPLANT
MANIFOLD NEPTUNE II (INSTRUMENTS) ×1 IMPLANT
PACK DNC HYST (MISCELLANEOUS) ×1 IMPLANT
PAD OB MATERNITY 4.3X12.25 (PERSONAL CARE ITEMS) ×1 IMPLANT
PAD PREP 24X41 OB/GYN DISP (PERSONAL CARE ITEMS) ×1 IMPLANT
SCRUB CHG 4% DYNA-HEX 4OZ (MISCELLANEOUS) ×1 IMPLANT
SEAL ROD LENS SCOPE MYOSURE (ABLATOR) ×1 IMPLANT
SET CYSTO W/LG BORE CLAMP LF (SET/KITS/TRAYS/PACK) IMPLANT
SYR 10ML LL (SYRINGE) IMPLANT
TRAP FLUID SMOKE EVACUATOR (MISCELLANEOUS) ×1 IMPLANT
TUBING CONNECTING 10 (TUBING) IMPLANT
WATER STERILE IRR 500ML POUR (IV SOLUTION) ×1 IMPLANT

## 2022-08-28 NOTE — Anesthesia Preprocedure Evaluation (Addendum)
Anesthesia Evaluation  Patient identified by MRN, date of birth, ID band Patient awake    Reviewed: Allergy & Precautions, H&P , NPO status , reviewed documented beta blocker date and time   History of Anesthesia Complications (+) Family history of anesthesia reaction  Airway Mallampati: II  TM Distance: >3 FB Neck ROM: full    Dental  (+) Teeth Intact, Dental Advidsory Given   Pulmonary asthma , sleep apnea  Rare inhaler   Pulmonary exam normal        Cardiovascular hypertension, Normal cardiovascular exam+ dysrhythmias      Neuro/Psych  PSYCHIATRIC DISORDERS Anxiety        GI/Hepatic ,GERD  Controlled,,  Endo/Other    Renal/GU      Musculoskeletal   Abdominal   Peds  Hematology   Anesthesia Other Findings Past Medical History: No date: Asthma     Comment:  inhalers in the past No date: Chlamydia No date: Dysrhythmia     Comment:  PVC's No date: Family history of adverse reaction to anesthesia     Comment:  mother died during hip fracture surgery. Discussed at length w pt, reassurance given since pt has personally received GOT anesthesia 3 times without incident.  No date: Family history of colon cancer in father No date: GERD (gastroesophageal reflux disease)     Comment:  not recent No date: Leiomyoma No date: Ovarian cyst Past Surgical History: No date: APPENDECTOMY No date: DILATION AND CURETTAGE OF UTERUS No date: dnc No date: INFERIOR OBLIQUE MYECTOMY No date: OVARIAN CYST REMOVAL   Reproductive/Obstetrics                             Anesthesia Physical Anesthesia Plan  ASA: 3  Anesthesia Plan: General ETT   Post-op Pain Management:    Induction: Intravenous  PONV Risk Score and Plan: 3 and Ondansetron, Treatment may vary due to age or medical condition, Dexamethasone and Midazolam  Airway Management Planned: LMA  Additional Equipment:   Intra-op Plan:    Post-operative Plan: Extubation in OR  Informed Consent: I have reviewed the patients History and Physical, chart, labs and discussed the procedure including the risks, benefits and alternatives for the proposed anesthesia with the patient or authorized representative who has indicated his/her understanding and acceptance.     Dental Advisory Given  Plan Discussed with: Anesthesiologist, CRNA and Surgeon  Anesthesia Plan Comments: (Patient consented for risks of anesthesia including but not limited to:  - adverse reactions to medications - damage to eyes, teeth, lips or other oral mucosa - nerve damage due to positioning  - sore throat or hoarseness - Damage to heart, brain, nerves, lungs, other parts of body or loss of life  Patient voiced understanding.)        Anesthesia Quick Evaluation

## 2022-08-28 NOTE — Interval H&P Note (Signed)
History and Physical Interval Note:  08/28/2022 7:28 AM  Alison Frazier  has presented today for surgery, with the diagnosis of Menorrhagia.  The various methods of treatment have been discussed with the patient and family. After consideration of risks, benefits and other options for treatment, the patient has consented to  Procedure(s): DILATATION AND CURETTAGE/HYSTEROSCOPY WITH ENDOMETRIAL ABLATION (N/A) as a surgical intervention.  The patient's history has been reviewed, patient examined, no change in status, stable for surgery.  I have reviewed the patient's chart and labs.  Questions were answered to the patient's satisfaction.     Rubie Maid, MD [facility]

## 2022-08-28 NOTE — Anesthesia Procedure Notes (Signed)
Procedure Name: LMA Insertion Date/Time: 08/28/2022 7:46 AM  Performed by: Braeden Dolinski, CRNAPre-anesthesia Checklist: Patient identified, Patient being monitored, Timeout performed, Emergency Drugs available and Suction available Patient Re-evaluated:Patient Re-evaluated prior to induction Oxygen Delivery Method: Circle system utilized Preoxygenation: Pre-oxygenation with 100% oxygen Induction Type: IV induction Ventilation: Mask ventilation without difficulty LMA: LMA inserted LMA Size: 4.0 Tube type: Oral Number of attempts: 1 Placement Confirmation: positive ETCO2 and breath sounds checked- equal and bilateral Tube secured with: Tape Dental Injury: Teeth and Oropharynx as per pre-operative assessment

## 2022-08-28 NOTE — Transfer of Care (Signed)
Immediate Anesthesia Transfer of Care Note  Patient: Alison Frazier  Procedure(s) Performed: DILATATION AND CURETTAGE/HYSTEROSCOPY WITH ENDOMETRIAL ABLATION  Patient Location: PACU  Anesthesia Type:General  Level of Consciousness: awake and alert   Airway & Oxygen Therapy: Patient Spontanous Breathing  Post-op Assessment: Report given to RN and Post -op Vital signs reviewed and stable  Post vital signs: Reviewed and stable  Last Vitals:  Vitals Value Taken Time  BP 146/109 08/28/22 0846  Temp 36.1 C 08/28/22 0846  Pulse 72 08/28/22 0849  Resp 14 08/28/22 0852  SpO2 100 % 08/28/22 0849  Vitals shown include unvalidated device data.  Last Pain:  Vitals:   08/28/22 5686  TempSrc: Oral         Complications: No notable events documented.

## 2022-08-28 NOTE — Discharge Instructions (Addendum)
AMBULATORY SURGERY  DISCHARGE INSTRUCTIONS   The drugs that you were given will stay in your system until tomorrow so for the next 24 hours you should not:  Drive an automobile Make any legal decisions Drink any alcoholic beverage   You may resume regular meals tomorrow.  Today it is better to start with liquids and gradually work up to solid foods.  You may eat anything you prefer, but it is better to start with liquids, then soup and crackers, and gradually work up to solid foods.   Please notify your doctor immediately if you have any unusual bleeding, trouble breathing, redness and pain at the surgery site, drainage, fever, or pain not relieved by medication.                    

## 2022-08-28 NOTE — Op Note (Signed)
Procedure(s): DILATATION AND CURETTAGE/HYSTEROSCOPY WITH ENDOMETRIAL ABLATION Procedure Note  Alison Frazier female 41 y.o. 08/28/2022  Indications: The patient is a 41 y.o. G0P0000 female with menorrhagia, small fibroid uterus (1 cm intramural)  Pre-operative Diagnosis: Menorrhagia, fibroid uterus.  Cardiac bigeminy noted by Cardiologist during procedure.   Post-operative Diagnosis: Same  Surgeon: Rubie Maid, MD  Assistants:  None.   Anesthesia: General endotracheal anesthesia  Findings: The uterus was sounded to 7 cm.  Cervical length 2 cm.  Weakly proliferative endometrium.  Tubal ostia were visualized bilaterally.  No intrauterine masses.  Adequate charring of endometrial tissue post ablation.  No perforations noted.   Procedure Details: The patient was seen in the Holding Room. The risks, benefits, complications, treatment options, and expected outcomes were discussed with the patient.  The patient concurred with the proposed plan, giving informed consent.  The site of surgery properly noted/marked. The patient was taken to the Operating Room, identified as [name] and the procedure verified as Procedure(s) (LRB): DILATATION AND CURETTAGE/HYSTEROSCOPY WITH ENDOMETRIAL ABLATION (N/A).   The patient was then placed under general anesthesia without difficulty.  She was prepped and draped in the normal sterile fashion, and placed in the dorsal lithotomy position.  A time out was performed.  An exam under anesthesia was performed with the findings noted above.  Straight catheterization was performed. A sterile speculum was inserted into vagina. A single-tooth tenaculum was used to grasp the anterior lip of the cervix. Cervical dilation was performed. A 5 mm hysteroscope was introduced into the uterus under direct visualization. The cavity was allowed to fill, and then the entire cavity was explored with the findings described above. The hysteroscope was removed, and a sharp  curette was then passed into the uterus and endometrial sampling was collected for pathology.   Next the Minerva instrument was primed per instructions. The instrument was then placed into the endometrial canal and activated per manufacturer instructions.  The Minerva instrument was then removed from the uterine cavity.  The hysteroscope was then re-introduced for final survey, with adequate charring of the endometrium noted.  The hysteroscope was removed from the patient's uterine cavity. Approximately 10 ml of 0.5% Sensorcaine was injected circumferentially into the cervix for analgesia. The tenaculum was removed and excellent hemostasis was noted. The speculum was removed from the vagina.   All instrument and sponge counts were correct at the end of the procedure x 2.  The patient tolerated the procedure well.  She was awakened and taken to the PACU in stable condition.  Patient will undergo 12-lead EKG in PACU.   Estimated Blood Loss:  minimal       Drains: straight catheterization with 200 ml at start of procedure.          Total IV Fluids:  500 ml  Specimens:  Endometrial curettings         Implants: None         Complications:  None; patient tolerated the procedure well.         Disposition: PACU - hemodynamically stable.         Condition: stable   Rubie Maid, MD [facility]

## 2022-08-28 NOTE — Anesthesia Postprocedure Evaluation (Signed)
Anesthesia Post Note  Patient: Alison Frazier  Procedure(s) Performed: DILATATION AND CURETTAGE/HYSTEROSCOPY WITH ENDOMETRIAL ABLATION  Patient location during evaluation: PACU Anesthesia Type: General Level of consciousness: awake and alert Pain management: pain level controlled Vital Signs Assessment: post-procedure vital signs reviewed and stable Respiratory status: spontaneous breathing, nonlabored ventilation, respiratory function stable and patient connected to nasal cannula oxygen Cardiovascular status: blood pressure returned to baseline and stable Postop Assessment: no apparent nausea or vomiting Anesthetic complications: no  No notable events documented.   Last Vitals:  Vitals:   08/28/22 0959 08/28/22 1011  BP: (!) 152/90 (!) 158/92  Pulse: 64 62  Resp: 19 18  Temp: (!) 36.1 C (!) 36.1 C  SpO2: 98% 99%    Last Pain:  Vitals:   08/28/22 1011  TempSrc: Temporal  PainSc:                  Dimas Millin

## 2022-08-29 ENCOUNTER — Encounter: Payer: Self-pay | Admitting: Obstetrics and Gynecology

## 2022-08-31 LAB — SURGICAL PATHOLOGY

## 2022-09-04 ENCOUNTER — Telehealth: Payer: Self-pay

## 2022-09-04 NOTE — Telephone Encounter (Signed)
Alison Frazier called triage with some concerns after surgery on last Friday with Dr. Marcelline Mates.  She's having a lot of discomfort and discharge is a lot a very strong odor not a fishy smell.   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  Pt is willing to come in to do a self swab.  Please advise.

## 2022-09-04 NOTE — Telephone Encounter (Signed)
Alison Frazier was transferred to the front office, there is no openings today for a nurse self swab.  Please advise?

## 2022-09-04 NOTE — Telephone Encounter (Signed)
Typically there is a watery brown discharge which may have an odor like old blood which can sometimes be strong.  It is usually not concerning as it goes away within 7-14 days.  If she has any itching, burning or pain associated with it, then I would be more concerned and  she could be seen at a walk-in clinic or Urgent care. She can alternate between 1000 mg of Tylenol and 600-800 mg of Ibuprofen every 3 hrs.  I can try to move up her post-op appt to sometime next Tuesday if she is still having issues (will use a work-in spot).

## 2022-09-09 ENCOUNTER — Telehealth: Payer: Self-pay

## 2022-09-09 MED ORDER — METRONIDAZOLE 500 MG PO TABS: 500.0000 mg | ORAL_TABLET | Freq: Two times a day (BID) | ORAL | 0 refills | Status: AC

## 2022-09-09 NOTE — Telephone Encounter (Signed)
Alison Frazier called triage line stating she has an infection stating she knows her body and knows its an infection. Wants to know if Dr. Marcelline Mates will call her in something or bring her in to be seen. Patient is very concerned.

## 2022-09-09 NOTE — Telephone Encounter (Signed)
Can send in Flagyl 500 mg BID x 7 days.

## 2022-09-11 ENCOUNTER — Ambulatory Visit (INDEPENDENT_AMBULATORY_CARE_PROVIDER_SITE_OTHER): Payer: BC Managed Care – PPO | Admitting: Obstetrics and Gynecology

## 2022-09-11 ENCOUNTER — Encounter: Payer: Self-pay | Admitting: Obstetrics and Gynecology

## 2022-09-11 VITALS — BP 167/72 | Resp 16 | Ht 64.0 in | Wt 185.8 lb

## 2022-09-11 DIAGNOSIS — Z9889 Other specified postprocedural states: Secondary | ICD-10-CM | POA: Diagnosis not present

## 2022-09-11 DIAGNOSIS — Z4889 Encounter for other specified surgical aftercare: Secondary | ICD-10-CM | POA: Diagnosis not present

## 2022-09-11 NOTE — Progress Notes (Signed)
    OBSTETRICS/GYNECOLOGY POST-OPERATIVE CLINIC VISIT  Subjective:     Alison Frazier is a 41 y.o. female who presents to the clinic 2 weeks status post DILATATION AND CURETTAGE/HYSTEROSCOPY WITH ENDOMETRIAL ABLATION  for  Menorrhagia . Eating a regular diet without difficulty. Bowel movements are normal. Pain is controlled with current analgesics. Medications being used: ibuprofen (OTC). Reports that she had an abnormal odor and discharge last week, was initiated on Flagyl and reports that her symptoms have improved. Is dealing with some emotional issues surrounding infertility at this time but still feels this was the best decision for her.   The following portions of the patient's history were reviewed and updated as appropriate: allergies, current medications, past family history, past medical history, past social history, past surgical history, and problem list.  Review of Systems Pertinent items noted in HPI and remainder of comprehensive ROS otherwise negative.   Objective:   LMP 08/02/2022  Body mass index is 31.89 kg/m. Blood pressure (!) 167/72, resp. rate 16, height '5\' 4"'$  (1.626 m), weight 185 lb 12.8 oz (84.3 kg), last menstrual period 08/02/2022.   General:  alert and no distress  Abdomen: soft, bowel sounds active, non-tender  Pelvis:   External genitalia normal, no lesions.  Vagina without discharge, scant pink tinged blood. Cervix appears normal, no lesions. Bimanual exam not performed.     Pathology:  A.  ENDOMETRIAL CURETTINGS:  - SECRETORY ENDOMETRIUM.  - NEGATIVE FOR ATYPIA/EIN AND MALIGNANCY.   Assessment:   Patient s/p DILATATION AND CURETTAGE/HYSTEROSCOPY WITH ENDOMETRIAL ABLATION  (surgery)  Bacterial vaginosis   Plan:   1. Continue any current medications as instructed by provider.   2. Operative findings again reviewed. Pathology report discussed. 3. Activity restrictions: none 4. Anticipated return to work:  09/07/2022 . 5. Follow up:  as needed.    Has PCP for routine health maintenance. Pap smear up to date, normal, performed this year.     Rubie Maid, MD [place]

## 2022-10-09 ENCOUNTER — Ambulatory Visit
Admission: RE | Admit: 2022-10-09 | Discharge: 2022-10-09 | Disposition: A | Discharge status: Home / Self Care | Payer: BC Managed Care – PPO | Source: Ambulatory Visit | Attending: Internal Medicine | Admitting: Internal Medicine

## 2022-10-09 DIAGNOSIS — Z1231 Encounter for screening mammogram for malignant neoplasm of breast: Secondary | ICD-10-CM | POA: Insufficient documentation

## 2022-12-10 ENCOUNTER — Other Ambulatory Visit: Payer: Self-pay | Admitting: Specialist

## 2022-12-10 DIAGNOSIS — R911 Solitary pulmonary nodule: Secondary | ICD-10-CM

## 2022-12-10 DIAGNOSIS — R0789 Other chest pain: Secondary | ICD-10-CM

## 2022-12-18 ENCOUNTER — Ambulatory Visit
Admission: RE | Admit: 2022-12-18 | Discharge: 2022-12-18 | Disposition: A | Discharge status: Home / Self Care | Payer: BC Managed Care – PPO | Source: Ambulatory Visit | Attending: Specialist | Admitting: Specialist

## 2022-12-18 DIAGNOSIS — R911 Solitary pulmonary nodule: Secondary | ICD-10-CM | POA: Insufficient documentation

## 2022-12-18 DIAGNOSIS — R0789 Other chest pain: Secondary | ICD-10-CM | POA: Insufficient documentation

## 2023-08-02 ENCOUNTER — Encounter: Payer: Self-pay | Admitting: Internal Medicine

## 2023-08-11 ENCOUNTER — Other Ambulatory Visit: Payer: Self-pay | Admitting: Internal Medicine

## 2023-08-11 DIAGNOSIS — Z1231 Encounter for screening mammogram for malignant neoplasm of breast: Secondary | ICD-10-CM

## 2023-08-24 ENCOUNTER — Ambulatory Visit: Payer: BC Managed Care – PPO | Admitting: Obstetrics

## 2023-08-24 ENCOUNTER — Encounter: Payer: Self-pay | Admitting: Obstetrics

## 2023-08-24 VITALS — BP 130/80 | Ht 64.0 in | Wt 189.0 lb

## 2023-08-24 DIAGNOSIS — N644 Mastodynia: Secondary | ICD-10-CM | POA: Diagnosis not present

## 2023-08-24 DIAGNOSIS — R110 Nausea: Secondary | ICD-10-CM

## 2023-08-24 DIAGNOSIS — R109 Unspecified abdominal pain: Secondary | ICD-10-CM

## 2023-08-24 DIAGNOSIS — Z3202 Encounter for pregnancy test, result negative: Secondary | ICD-10-CM | POA: Diagnosis not present

## 2023-08-24 DIAGNOSIS — R5383 Other fatigue: Secondary | ICD-10-CM

## 2023-08-24 LAB — POCT URINE PREGNANCY: Preg Test, Ur: NEGATIVE

## 2023-08-24 NOTE — Progress Notes (Signed)
    GYNECOLOGY PROGRESS NOTE  Subjective:  PCP: Billey Chang, MD  Patient ID: Alison Frazier, female    DOB: [birth year], 42 y.o.   MRN: [id number]  HPI  Patient is a 42 y.o. G0P0000 female who presents for pregnancy symptoms for the past few weeks. She is s/p endometrial ablation 08/28/22 and has only had 1 period since, in Feb 2024. She is not on contraception and reports hx of PCOS and spouse with "low sperm count" and they tried to get pregnant for several years without success, so she decided not to take contraception after her ablation. Last unprotected intercourse was approx 3wks ago and since that time she has been feeling fatigue, nausea, breast tenderness, and mild cramping. Requesting pregnancy test today, has not taken a home test.   Reviewed allergies, current medications, past family history, past medical history, past social history, past surgical history, and problem list.  Review of Systems Pertinent items noted in HPI and remainder of comprehensive ROS otherwise negative.   Objective:   Blood pressure 130/80, height 5\' 4"  (1.626 m), weight 189 lb (85.7 kg), last menstrual period 10/31/2022. Body mass index is 32.44 kg/m.  General appearance: alert and cooperative Respiratory: normal work of breathing Neurologic: Grossly normal  Assessment/Plan:   1. Nausea   2. Fatigue, unspecified type   3. Breast tenderness   4. Abdominal cramps     UPT negative in office today. Pt likely having molimina vs perimenopausal symptoms. Discussed perimenopausal anticipatory guidance. Offered contraception at this time, pt declines. We reviewed the risks of pregnancy after ablation and pt states understanding. Recommend she take another home test in about 1-2 weeks to be sure she is not pregnant, based on her last intercourse, and if she is, should call office immediately.  Recommend returning for annual exam in 1-2 mos to update preventative health measures.   Total time was 30  minutes. That includes chart review before the visit, the actual patient visit, and time spent on documentation after the visit. Time excludes procedures, if any.    Julieanne Manson, DO [facility]

## 2023-09-15 ENCOUNTER — Ambulatory Visit: Payer: BC Managed Care – PPO | Admitting: Obstetrics and Gynecology

## 2023-10-10 NOTE — Progress Notes (Signed)
 PCP:  Vicci Norleen Blunt, MD   Chief Complaint  Patient presents with   Gynecologic Exam    Pelvic pain/discomfort once a month     HPI:      [name] is a 43 y.o. No obstetric history on file. who LMP was Patient's last menstrual period was 10/29/2022 (approximate)., presents today for her annual examination.  Her menses are absent since s/p hyst D&C and ablation for menorrhagia 12/23; no menses/BTB since 2/24. She does get dysmen/RLQ monthly when menses due, improved with NSAIDs. Hx of ovar cysts in past. S/p RT salpingectomy due to hydrosalpinx 6/21. Also with hx of ovar cysts and 2 cm leio and 1 cm leio per 3/23 u/s results.  Had nausea 11/24 with neg UPT, sx have resolved.   Sex activity: single partner, contraception - none/hx of infertility. S/p endometrial ablation now, knows risk of pregnancy with ablation. No pain/bleeding/dryness.  Last Pap: 02/04/21 Results were ASCUS/neg HPV DNA. 11/08/19 Results were no abnormalities/POS HPV DNA;  no hx of abn with tx in past. Hx of STDs: chlamydia in distant past; HPV 2021  Last mammogram: 10/09/22 Results were no abnormalities, repeat in 12 months.  There is no FH of breast cancer. There is no FH of ovarian cancer. The patient does do self-breast exams.  Tobacco use: The patient denies current or previous tobacco use. Alcohol use: none No drug use.  Exercise: occas active  She does get adequate calcium but not Vitamin D in her diet. Labs with PCP.   Past Medical History:  Diagnosis Date   Anxiety    Asthma    Atypical chest pain    a.) cCTA 11/03/2021: Ca score 0.   Cervical high risk HPV (human papillomavirus) test positive    Chlamydia    Diastolic dysfunction 05/08/2020   a.) TTE 05/08/2020: EF 50%, mild LV/LAE, triv TR/PR, mild MR, G1DD   Family history of adverse reaction to anesthesia    a.) early emergence in 3rd degree relative (maternal cousin)   Family history of colon cancer in father    GERD  (gastroesophageal reflux disease)    Heart murmur    Hypertension    Ileus (HCC)    Leiomyoma    OSA on CPAP    a.) non-compliant with prescibed nocturnal PAP therapy   Ovarian cyst    PVC (premature ventricular contraction)      Past Surgical History:  Procedure Laterality Date   APPENDECTOMY     CHROMOPERTUBATION N/A 03/14/2020   Procedure: CHROMOPERTUBATION;  Surgeon: Arloa Lamar SQUIBB, MD;  Location: [facility];  Service: Gynecology;  Laterality: N/A;   DILATATION AND CURETTAGE/HYSTEROSCOPY WITH MINERVA N/A 08/28/2022   Procedure: DILATATION AND CURETTAGE/HYSTEROSCOPY WITH ENDOMETRIAL ABLATION;  Surgeon: Connell Davies, MD;  Location: [facility];  Service: Gynecology;  Laterality: N/A;   DILATION AND CURETTAGE OF UTERUS     INFERIOR OBLIQUE MYECTOMY     LAPAROSCOPIC LYSIS OF ADHESIONS  03/14/2020   Procedure: LAPAROSCOPIC LYSIS OF ADHESIONS;  Surgeon: Arloa Lamar SQUIBB, MD;  Location: [facility];  Service: Gynecology;;   LAPAROSCOPIC UNILATERAL SALPINGO OOPHERECTOMY N/A 03/14/2020   Procedure: OPERATIVE LAPAROSCOPY RIGHT SALPINGECTOMY AND CHROMOPERTUBATION;  Surgeon: Arloa Lamar SQUIBB, MD;  Location: [facility];  Service: Gynecology;  Laterality: N/A;   LAPAROSCOPY N/A 03/17/2020   Procedure: ROBOTIC ASSISTED LAPAROSCOPY, DIAGNOSTIC;  Surgeon: Lane Shope, MD;  Location: [facility];  Service: General;  Laterality: N/A;   OVARIAN CYST REMOVAL     SMALL BOWEL  REPAIR N/A 03/17/2020   Procedure: SMALL BOWEL REPAIR, laparoscopic robotic assisted.;  Surgeon: Lane Shope, MD;  Location: [facility];  Service: General;  Laterality: N/A;    Family History  Problem Relation Age of Onset   Stroke Mother    Diabetes Father    Hypertension Father    Colon cancer Father    Throat cancer Father    Breast cancer Neg Hx    Ovarian cancer Neg Hx     Social History   Socioeconomic History   Marital status: Married    Spouse name: Not on file   Number of children: Not on file   Years of  education: Not on file   Highest education level: Not on file  Occupational History   Not on file  Tobacco Use   Smoking status: Never   Smokeless tobacco: Never  Vaping Use   Vaping status: Never Used  Substance and Sexual Activity   Alcohol use: Not Currently   Drug use: Not Currently   Sexual activity: Yes    Birth control/protection: None  Other Topics Concern   Not on file  Social History Narrative   Not on file   Social Drivers of Health   Financial Resource Strain: Not on file  Food Insecurity: Not on file  Transportation Needs: Not on file  Physical Activity: Not on file  Stress: Not on file  Social Connections: Not on file  Intimate Partner Violence: Not on file     Current Outpatient Medications:    albuterol (VENTOLIN HFA) 108 (90 Base) MCG/ACT inhaler, INHALE 2 INHALATIONS INTO THE LUNGS EVERY 6 HOURS AS NEEDED FOR WHEEZING, Disp: , Rfl:    AUVI-Q  0.3 MG/0.3ML SOAJ injection, Inject into the muscle., Disp: , Rfl:    carvedilol (COREG) 3.125 MG tablet, Take 3.125 mg by mouth 2 (two) times daily with a meal., Disp: , Rfl:    ibuprofen  (ADVIL ) 600 MG tablet, Take 1 tablet (600 mg total) by mouth every 6 (six) hours as needed., Disp: 30 tablet, Rfl: 1   montelukast (SINGULAIR) 10 MG tablet, Take 1 tablet by mouth at bedtime., Disp: , Rfl:    losartan (COZAAR) 25 MG tablet, Take 1 tablet by mouth every morning., Disp: , Rfl:      ROS:  Review of Systems  Constitutional:  Negative for fatigue, fever and unexpected weight change.  Respiratory:  Negative for cough, shortness of breath and wheezing.   Cardiovascular:  Negative for chest pain, palpitations and leg swelling.  Gastrointestinal:  Negative for blood in stool, constipation, diarrhea, nausea and vomiting.  Endocrine: Negative for cold intolerance, heat intolerance and polyuria.  Genitourinary:  Positive for pelvic pain. Negative for dyspareunia, dysuria, flank pain, frequency, genital sores, hematuria,  menstrual problem, urgency, vaginal bleeding, vaginal discharge and vaginal pain.  Musculoskeletal:  Negative for back pain, joint swelling and myalgias.  Skin:  Negative for rash.  Neurological:  Negative for dizziness, syncope, light-headedness, numbness and headaches.  Hematological:  Negative for adenopathy.  Psychiatric/Behavioral:  Positive for agitation. Negative for confusion, sleep disturbance and suicidal ideas. The patient is not nervous/anxious.   BREAST: No symptoms   Objective: BP (!) 157/76   Pulse (!) 43   Ht 5' 4 (1.626 m)   Wt 191 lb (86.6 kg)   LMP 10/29/2022 (Approximate)   BMI 32.79 kg/m    Physical Exam Constitutional:      Appearance: She is well-developed.  Genitourinary:     Vulva normal.  Right Labia: No rash, tenderness or lesions.    Left Labia: No tenderness, lesions or rash.    No vaginal discharge, erythema or tenderness.      Right Adnexa: not tender and no mass present.    Left Adnexa: not tender and no mass present.    No cervical friability or polyp.     Uterus is not enlarged or tender.  Breasts:    Right: No mass, nipple discharge, skin change or tenderness.     Left: No mass, nipple discharge, skin change or tenderness.  Neck:     Thyroid: No thyromegaly.  Cardiovascular:     Rate and Rhythm: Normal rate and regular rhythm.     Heart sounds: Normal heart sounds. No murmur heard. Pulmonary:     Effort: Pulmonary effort is normal.     Breath sounds: Normal breath sounds.  Abdominal:     Palpations: Abdomen is soft.     Tenderness: There is no abdominal tenderness. There is no guarding or rebound.  Musculoskeletal:        General: Normal range of motion.     Cervical back: Normal range of motion.  Lymphadenopathy:     Cervical: No cervical adenopathy.  Neurological:     General: No focal deficit present.     Mental Status: She is alert and oriented to person, place, and time.     Cranial Nerves: No cranial nerve deficit.   Skin:    General: Skin is warm and dry.  Psychiatric:        Mood and Affect: Mood normal.        Behavior: Behavior normal.        Thought Content: Thought content normal.        Judgment: Judgment normal.  Vitals reviewed.    Assessment/Plan: Encounter for annual routine gynecological examination  Cervical cancer screening - Plan: Cytology - PAP  Screening for HPV (human papillomavirus) - Plan: Cytology - PAP  ASCUS of cervix with negative high risk HPV - Plan: Cytology - PAP; repeat pap today  Encounter for screening mammogram for malignant neoplasm of breast - Plan: MM 3D SCREENING MAMMOGRAM BILATERAL BREAST; pt to schedule mammo  Dysmenorrhea--sx normal vs RTO cyst vs leio but no longer having bleeding due to ablation. Neg exam today. NSAIDs/heating pad. Also has hx of leio. If sx persist/worsen, will check GYN u/s.   Leiomyoma      GYN counsel breast self exam, mammography screening, adequate intake of calcium and vitamin D, diet and exercise     F/U  No follow-ups on file.  Takiya Belmares B. Quinnetta Roepke, PA-C 10/11/2023 4:14 PM

## 2023-10-11 ENCOUNTER — Ambulatory Visit (INDEPENDENT_AMBULATORY_CARE_PROVIDER_SITE_OTHER): Payer: BC Managed Care – PPO | Admitting: Obstetrics and Gynecology

## 2023-10-11 ENCOUNTER — Other Ambulatory Visit (HOSPITAL_COMMUNITY)
Admission: RE | Admit: 2023-10-11 | Discharge: 2023-10-11 | Disposition: A | Discharge status: Home / Self Care | Payer: 59 | Source: Ambulatory Visit | Attending: Obstetrics and Gynecology | Admitting: Obstetrics and Gynecology

## 2023-10-11 ENCOUNTER — Encounter: Payer: Self-pay | Admitting: Obstetrics and Gynecology

## 2023-10-11 VITALS — BP 147/75 | HR 65 | Ht 64.0 in | Wt 191.0 lb

## 2023-10-11 DIAGNOSIS — R102 Pelvic and perineal pain unspecified side: Secondary | ICD-10-CM

## 2023-10-11 DIAGNOSIS — Z01419 Encounter for gynecological examination (general) (routine) without abnormal findings: Secondary | ICD-10-CM | POA: Diagnosis not present

## 2023-10-11 DIAGNOSIS — Z1231 Encounter for screening mammogram for malignant neoplasm of breast: Secondary | ICD-10-CM

## 2023-10-11 DIAGNOSIS — Z124 Encounter for screening for malignant neoplasm of cervix: Secondary | ICD-10-CM | POA: Insufficient documentation

## 2023-10-11 DIAGNOSIS — R87610 Atypical squamous cells of undetermined significance on cytologic smear of cervix (ASC-US): Secondary | ICD-10-CM | POA: Diagnosis present

## 2023-10-11 DIAGNOSIS — Z1151 Encounter for screening for human papillomavirus (HPV): Secondary | ICD-10-CM

## 2023-10-11 DIAGNOSIS — D219 Benign neoplasm of connective and other soft tissue, unspecified: Secondary | ICD-10-CM

## 2023-10-11 NOTE — Patient Instructions (Addendum)
 I value your feedback and you entrusting [place] with your care. If you get a [facility] patient survey, I would appreciate you taking the time to let us know about your experience today. Thank you!  [facility] ([place])--[phone]

## 2023-10-18 LAB — CYTOLOGY - PAP
Adequacy: ABSENT
Comment: NEGATIVE
Diagnosis: NEGATIVE
High risk HPV: NEGATIVE

## 2023-10-19 ENCOUNTER — Ambulatory Visit
Admission: RE | Admit: 2023-10-19 | Discharge: 2023-10-19 | Disposition: A | Discharge status: Home / Self Care | Payer: 59 | Source: Ambulatory Visit | Attending: Internal Medicine | Admitting: Internal Medicine

## 2023-10-19 DIAGNOSIS — Z1231 Encounter for screening mammogram for malignant neoplasm of breast: Secondary | ICD-10-CM | POA: Insufficient documentation

## 2024-03-16 ENCOUNTER — Ambulatory Visit: Admitting: Obstetrics and Gynecology

## 2024-03-16 ENCOUNTER — Encounter: Payer: Self-pay | Admitting: Obstetrics and Gynecology

## 2024-03-16 ENCOUNTER — Ambulatory Visit (INDEPENDENT_AMBULATORY_CARE_PROVIDER_SITE_OTHER)

## 2024-03-16 VITALS — BP 148/81 | HR 67 | Ht 64.0 in | Wt 188.0 lb

## 2024-03-16 DIAGNOSIS — D219 Benign neoplasm of connective and other soft tissue, unspecified: Secondary | ICD-10-CM

## 2024-03-16 DIAGNOSIS — R102 Pelvic and perineal pain: Secondary | ICD-10-CM

## 2024-03-16 DIAGNOSIS — N939 Abnormal uterine and vaginal bleeding, unspecified: Secondary | ICD-10-CM | POA: Diagnosis not present

## 2024-03-16 DIAGNOSIS — D259 Leiomyoma of uterus, unspecified: Secondary | ICD-10-CM | POA: Diagnosis not present

## 2024-03-16 NOTE — Progress Notes (Signed)
 Alison Route, MD   Chief Complaint  Patient presents with   Pelvic Pain    Entire area x 3 days. Denies UTI sx. Pt states she did not take her blood pressure medication this morning.    HPI:      [name] is a 43 y.o. G0P0000 whose LMP was No LMP recorded. Patient has had an ablation., presents today for pelvic pain since Tuesday. Started as LLQ sharp pain Tues night and Wed turned into suprapubic pain with persisting LLQ sharp pains. Suprapubic pain is constant intense cramping and pressure, with nausea from the pain. Also with gas/bloating last night. Tried heating pad/NSAIDs without relief. Was able to sleep last night after taking unisom; sx decreased today Had BM yesterday without sx change; Bms usually 3 times per wk for pt which is normal; no urin or vag sx, no fevers/chills. Hx of 1 cm and 2 cm leio on 3/23 GYN u/s; s/p myomectomy in the past; hx of ovar cysts. Hx of ileus with perforated abd viscus with small bowel repair and LOA 6/21.  Not currently sexually active. S/p endometrial ablation for menorrhagia; no BTB.   Patient Active Problem List   Diagnosis Date Noted   Cervical high risk HPV (human papillomavirus) test positive 02/05/2021   Perforated abdominal viscus    Ileus (HCC) 03/17/2020   S/P laparoscopy 03/14/2020   Pelvic pain 01/29/2020   Leiomyoma 11/08/2019   Hydrosalpinx 11/08/2019    Past Surgical History:  Procedure Laterality Date   APPENDECTOMY     CHROMOPERTUBATION N/A 03/14/2020   Procedure: CHROMOPERTUBATION;  Surgeon: Alben Alma, MD;  Location: [facility];  Service: Gynecology;  Laterality: N/A;   DILATATION AND CURETTAGE/HYSTEROSCOPY WITH MINERVA N/A 08/28/2022   Procedure: DILATATION AND CURETTAGE/HYSTEROSCOPY WITH ENDOMETRIAL ABLATION;  Surgeon: Teresa Fender, MD;  Location: [facility];  Service: Gynecology;  Laterality: N/A;   DILATION AND CURETTAGE OF UTERUS     INFERIOR OBLIQUE MYECTOMY     LAPAROSCOPIC LYSIS OF  ADHESIONS  03/14/2020   Procedure: LAPAROSCOPIC LYSIS OF ADHESIONS;  Surgeon: Alben Alma, MD;  Location: [facility];  Service: Gynecology;;   LAPAROSCOPIC UNILATERAL SALPINGO OOPHERECTOMY N/A 03/14/2020   Procedure: OPERATIVE LAPAROSCOPY RIGHT SALPINGECTOMY AND CHROMOPERTUBATION;  Surgeon: Alben Alma, MD;  Location: [facility];  Service: Gynecology;  Laterality: N/A;   LAPAROSCOPY N/A 03/17/2020   Procedure: ROBOTIC ASSISTED LAPAROSCOPY, DIAGNOSTIC;  Surgeon: Flynn Hylan, MD;  Location: [facility];  Service: General;  Laterality: N/A;   OVARIAN CYST REMOVAL     SMALL BOWEL REPAIR N/A 03/17/2020   Procedure: SMALL BOWEL REPAIR, laparoscopic robotic assisted.;  Surgeon: Flynn Hylan, MD;  Location: [facility];  Service: General;  Laterality: N/A;    Family History  Problem Relation Age of Onset   Stroke Mother    Diabetes Father    Hypertension Father    Colon cancer Father    Throat cancer Father    Breast cancer Neg Hx    Ovarian cancer Neg Hx     Social History   Socioeconomic History   Marital status: Married    Spouse name: Not on file   Number of children: Not on file   Years of education: Not on file   Highest education level: Not on file  Occupational History   Not on file  Tobacco Use   Smoking status: Never   Smokeless tobacco: Never  Vaping Use   Vaping status: Never Used  Substance and Sexual  Activity   Alcohol use: Not Currently   Drug use: Not Currently   Sexual activity: Not Currently    Birth control/protection: None  Other Topics Concern   Not on file  Social History Narrative   Not on file   Social Drivers of Health   Financial Resource Strain: Not on file  Food Insecurity: Not on file  Transportation Needs: Not on file  Physical Activity: Not on file  Stress: Not on file  Social Connections: Not on file  Intimate Partner Violence: Not on file    Outpatient Medications Prior to Visit  Medication Sig Dispense Refill   albuterol  (VENTOLIN HFA) 108 (90 Base) MCG/ACT inhaler INHALE 2 INHALATIONS INTO THE LUNGS EVERY 6 HOURS AS NEEDED FOR WHEEZING     AUVI-Q  0.3 MG/0.3ML SOAJ injection Inject into the muscle.     carvedilol (COREG) 3.125 MG tablet Take 3.125 mg by mouth 2 (two) times daily with a meal.     ibuprofen  (ADVIL ) 600 MG tablet Take 1 tablet (600 mg total) by mouth every 6 (six) hours as needed. 30 tablet 1   losartan (COZAAR) 25 MG tablet Take 1 tablet by mouth every morning.     montelukast (SINGULAIR) 10 MG tablet Take 1 tablet by mouth at bedtime.     No facility-administered medications prior to visit.      ROS:  Review of Systems  Constitutional:  Negative for fever.  Gastrointestinal:  Positive for constipation. Negative for blood in stool, diarrhea, nausea and vomiting.  Genitourinary:  Positive for pelvic pain. Negative for dyspareunia, dysuria, flank pain, frequency, hematuria, urgency, vaginal bleeding, vaginal discharge and vaginal pain.  Musculoskeletal:  Negative for back pain.  Skin:  Negative for rash.   BREAST: No symptoms   OBJECTIVE:   Vitals:  BP (!) 148/81   Pulse 67   Ht 5' 4 (1.626 m)   Wt 188 lb (85.3 kg)   BMI 32.27 kg/m   Physical Exam Vitals reviewed.  Constitutional:      Appearance: She is well-developed.  Pulmonary:     Effort: Pulmonary effort is normal.  Abdominal:     Palpations: Abdomen is soft.     Tenderness: There is abdominal tenderness in the suprapubic area. There is no guarding or rebound.  Genitourinary:    General: Normal vulva.     Pubic Area: No rash.      Labia:        Right: No rash, tenderness or lesion.        Left: No rash, tenderness or lesion.      Vagina: Normal. No vaginal discharge, erythema or tenderness.     Cervix: Normal.     Uterus: Normal. Tender. Not enlarged.      Adnexa: Left adnexa normal.       Right: No mass or tenderness.         Left: No mass or tenderness.     Musculoskeletal:        General: Normal range of  motion.     Cervical back: Normal range of motion.   Skin:    General: Skin is warm and dry.   Neurological:     General: No focal deficit present.     Mental Status: She is alert and oriented to person, place, and time.   Psychiatric:        Mood and Affect: Mood normal.        Behavior: Behavior normal.  Thought Content: Thought content normal.        Judgment: Judgment normal.     Assessment/Plan: Pelvic pain - Plan: US  PELVIS TRANSVAGINAL NON-OB (TV ONLY); tender on exam, check GYN u/s later today. Will f/u with results. Sx improved today.   Leiomyoma - Plan: US  PELVIS TRANSVAGINAL NON-OB (TV ONLY)   Return for 3:00 GYN u/s --[facility] to call wiht results.  Gio Janoski B. Voula Waln, PA-C 03/16/2024 10:39 AM

## 2024-03-16 NOTE — Patient Instructions (Signed)
 I value your feedback and you entrusting [place] with your care. If you get a [facility] patient survey, I would appreciate you taking the time to let us know about your experience today. Thank you! ? ? ?

## 2024-03-17 ENCOUNTER — Telehealth: Payer: Self-pay | Admitting: Obstetrics and Gynecology

## 2024-03-17 NOTE — Telephone Encounter (Signed)
 LM with GYN u/s results. Stable leio, no ovar cysts. Pt s/p ablation, Question pain from cycle with no bleeding. Asked pt to call back if still having bad sx; had improved yesterday.

## 2024-03-17 NOTE — Telephone Encounter (Signed)
 Pt called back stating still having LLQ pain, about the same as Tues, can't do much today. Trying low dose NSAIDs. Recommended 800 mg ibup TID and heating pad. If sx persist, she needs further eval for possible GI vs MSK causes and can f/u with PCP. No GYN etiology for pain. Pt frustrated hoping I could give her answers to the cause of her pain.

## 2024-03-21 ENCOUNTER — Ambulatory Visit: Payer: Self-pay | Admitting: Obstetrics and Gynecology

## 2024-03-21 NOTE — Progress Notes (Signed)
 Will you pls call pt to see how her pain is since last wk. Was more on LLQ. Thx.

## 2024-03-31 ENCOUNTER — Other Ambulatory Visit: Payer: Self-pay

## 2024-03-31 ENCOUNTER — Emergency Department
Admission: EM | Admit: 2024-03-31 | Discharge: 2024-03-31 | Disposition: A | Discharge status: Home / Self Care | Attending: Emergency Medicine | Admitting: Emergency Medicine

## 2024-03-31 DIAGNOSIS — M5416 Radiculopathy, lumbar region: Secondary | ICD-10-CM | POA: Diagnosis not present

## 2024-03-31 DIAGNOSIS — I110 Hypertensive heart disease with heart failure: Secondary | ICD-10-CM | POA: Insufficient documentation

## 2024-03-31 DIAGNOSIS — M545 Low back pain, unspecified: Secondary | ICD-10-CM | POA: Diagnosis present

## 2024-03-31 DIAGNOSIS — I509 Heart failure, unspecified: Secondary | ICD-10-CM | POA: Diagnosis not present

## 2024-03-31 DIAGNOSIS — M25512 Pain in left shoulder: Secondary | ICD-10-CM

## 2024-03-31 HISTORY — DX: Heart failure, unspecified: I50.9

## 2024-03-31 MED ORDER — HYDROCODONE-ACETAMINOPHEN 5-325 MG PO TABS: 1.0000 | ORAL_TABLET | Freq: Four times a day (QID) | ORAL | 0 refills | Status: AC | PRN

## 2024-03-31 MED ORDER — GABAPENTIN 100 MG PO CAPS: 100.0000 mg | ORAL_CAPSULE | Freq: Every day | ORAL | 0 refills | Status: AC

## 2024-03-31 MED ORDER — CYCLOBENZAPRINE HCL 5 MG PO TABS: 5.0000 mg | ORAL_TABLET | Freq: Three times a day (TID) | ORAL | 0 refills | Status: AC | PRN

## 2024-03-31 MED ORDER — KETOROLAC TROMETHAMINE 60 MG/2ML IM SOLN
30.0000 mg | Freq: Once | INTRAMUSCULAR | Status: AC
  Administered 2024-03-31: 30 mg via INTRAMUSCULAR
  Filled 2024-03-31: qty 2

## 2024-03-31 NOTE — ED Triage Notes (Signed)
 Pt to ED for bilateral leg numbness, hx herniated lumbar disc. States has been struggling to walk for last 2 days, hard to lift legs and walking more slowly. Lower back feels stiff. Denies new urinary or fecal incontinence. Saw PCP and was recently on prednisone  but it didn't help.

## 2024-03-31 NOTE — Discharge Instructions (Signed)
 Please follow up with the neurologist. Call Monday to request an appointment.   Follow up with primary care if you need an appointment sooner than you can get in with the specialist if symptoms are not improving with medications.  The cyclobenzaprine  and oxycodone  may cause dizziness or drowsiness. Do not drive or operate machinery for at least 8 hours after the last dose.
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
## 2024-04-05 ENCOUNTER — Other Ambulatory Visit: Payer: Self-pay | Admitting: Neurology

## 2024-04-05 DIAGNOSIS — R200 Anesthesia of skin: Secondary | ICD-10-CM

## 2024-04-05 DIAGNOSIS — M545 Low back pain, unspecified: Secondary | ICD-10-CM

## 2024-04-06 ENCOUNTER — Encounter: Payer: Self-pay | Admitting: Neurology

## 2024-04-07 ENCOUNTER — Ambulatory Visit
Admission: RE | Admit: 2024-04-07 | Discharge: 2024-04-07 | Disposition: A | Discharge status: Home / Self Care | Source: Ambulatory Visit | Attending: Neurology | Admitting: Neurology

## 2024-04-07 DIAGNOSIS — M545 Low back pain, unspecified: Secondary | ICD-10-CM

## 2024-04-07 DIAGNOSIS — R200 Anesthesia of skin: Secondary | ICD-10-CM

## 2024-04-26 ENCOUNTER — Emergency Department

## 2024-04-26 ENCOUNTER — Encounter: Payer: Self-pay | Admitting: Emergency Medicine

## 2024-04-26 ENCOUNTER — Other Ambulatory Visit: Payer: Self-pay

## 2024-04-26 ENCOUNTER — Emergency Department
Admission: EM | Admit: 2024-04-26 | Discharge: 2024-04-26 | Discharge status: Against Medical Advice | Source: Ambulatory Visit | Attending: Emergency Medicine | Admitting: Emergency Medicine

## 2024-04-26 DIAGNOSIS — R001 Bradycardia, unspecified: Secondary | ICD-10-CM | POA: Diagnosis present

## 2024-04-26 DIAGNOSIS — Z5321 Procedure and treatment not carried out due to patient leaving prior to being seen by health care provider: Secondary | ICD-10-CM | POA: Insufficient documentation

## 2024-04-26 LAB — CBC
HCT: 39.7 % (ref 36.0–46.0)
Hemoglobin: 13 g/dL (ref 12.0–15.0)
MCH: 26.8 pg (ref 26.0–34.0)
MCHC: 32.7 g/dL (ref 30.0–36.0)
MCV: 81.9 fL (ref 80.0–100.0)
Platelets: 384 K/uL (ref 150–400)
RBC: 4.85 MIL/uL (ref 3.87–5.11)
RDW: 12.7 % (ref 11.5–15.5)
WBC: 4.7 K/uL (ref 4.0–10.5)
nRBC: 0 % (ref 0.0–0.2)

## 2024-04-26 LAB — BASIC METABOLIC PANEL WITH GFR
Anion gap: 7 (ref 5–15)
BUN: 9 mg/dL (ref 6–20)
CO2: 23 mmol/L (ref 22–32)
Calcium: 8.8 mg/dL — ABNORMAL LOW (ref 8.9–10.3)
Chloride: 105 mmol/L (ref 98–111)
Creatinine, Ser: 0.86 mg/dL (ref 0.44–1.00)
GFR, Estimated: >60 mL/min (ref >60)
Glucose, Bld: 89 mg/dL (ref 70–99)
Potassium: 3.7 mmol/L (ref 3.5–5.1)
Sodium: 135 mmol/L (ref 135–145)

## 2024-04-26 LAB — TROPONIN I (HIGH SENSITIVITY): Troponin I (High Sensitivity): 3 ng/L (ref <18)

## 2024-04-26 NOTE — ED Triage Notes (Signed)
 Patient to ED from [facility] for low heart rate. PT reports HR of 36 in office. PT being seen for her back. Denies CP or SOB.

## 2024-05-12 ENCOUNTER — Other Ambulatory Visit: Payer: Self-pay | Admitting: Family Medicine

## 2024-05-12 ENCOUNTER — Encounter: Payer: Self-pay | Admitting: Family Medicine

## 2024-05-12 DIAGNOSIS — M5412 Radiculopathy, cervical region: Secondary | ICD-10-CM

## 2024-05-13 ENCOUNTER — Inpatient Hospital Stay: Admission: RE | Admit: 2024-05-13 | Source: Ambulatory Visit

## 2024-06-07 ENCOUNTER — Ambulatory Visit: Admission: EM | Admit: 2024-06-07 | Discharge: 2024-06-07 | Discharge status: Against Medical Advice

## 2024-06-22 ENCOUNTER — Emergency Department

## 2024-06-22 ENCOUNTER — Other Ambulatory Visit: Payer: Self-pay

## 2024-06-22 ENCOUNTER — Emergency Department
Admission: EM | Admit: 2024-06-22 | Discharge: 2024-06-22 | Disposition: A | Discharge status: Home / Self Care | Attending: Emergency Medicine | Admitting: Emergency Medicine

## 2024-06-22 DIAGNOSIS — R079 Chest pain, unspecified: Secondary | ICD-10-CM

## 2024-06-22 DIAGNOSIS — I509 Heart failure, unspecified: Secondary | ICD-10-CM | POA: Insufficient documentation

## 2024-06-22 DIAGNOSIS — I110 Hypertensive heart disease with heart failure: Secondary | ICD-10-CM | POA: Diagnosis not present

## 2024-06-22 DIAGNOSIS — R0789 Other chest pain: Secondary | ICD-10-CM | POA: Insufficient documentation

## 2024-06-22 LAB — BASIC METABOLIC PANEL WITH GFR
Anion gap: 13 (ref 5–15)
BUN: 11 mg/dL (ref 6–20)
CO2: 24 mmol/L (ref 22–32)
Calcium: 9 mg/dL (ref 8.9–10.3)
Chloride: 97 mmol/L — ABNORMAL LOW (ref 98–111)
Creatinine, Ser: 0.92 mg/dL (ref 0.44–1.00)
GFR, Estimated: >60 mL/min (ref >60)
Glucose, Bld: 100 mg/dL — ABNORMAL HIGH (ref 70–99)
Potassium: 3.5 mmol/L (ref 3.5–5.1)
Sodium: 134 mmol/L — ABNORMAL LOW (ref 135–145)

## 2024-06-22 LAB — CBC
HCT: 41.3 % (ref 36.0–46.0)
Hemoglobin: 13.7 g/dL (ref 12.0–15.0)
MCH: 27 pg (ref 26.0–34.0)
MCHC: 33.2 g/dL (ref 30.0–36.0)
MCV: 81.3 fL (ref 80.0–100.0)
Platelets: 472 K/uL — ABNORMAL HIGH (ref 150–400)
RBC: 5.08 MIL/uL (ref 3.87–5.11)
RDW: 12.2 % (ref 11.5–15.5)
WBC: 4.9 K/uL (ref 4.0–10.5)
nRBC: 0 % (ref 0.0–0.2)

## 2024-06-22 LAB — TROPONIN I (HIGH SENSITIVITY)
Troponin I (High Sensitivity): <2 ng/L (ref <18)
Troponin I (High Sensitivity): 3 ng/L (ref <18)

## 2024-06-22 MED ORDER — IOHEXOL 350 MG/ML SOLN
75.0000 mL | Freq: Once | INTRAVENOUS | Status: AC | PRN
  Administered 2024-06-22: 75 mL via INTRAVENOUS

## 2024-06-22 NOTE — ED Provider Notes (Addendum)
 [facility] Provider Note    Event Date/Time   First MD Initiated Contact with Patient 06/22/24 1403     (approximate)   History   Chest Pain   HPI  Alison Frazier is a 43 y.o. female with a history of CHF, hypertension, PVCs for which she sees cardiology and plan is for ablation who presents with complaints of chest discomfort.  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  She recently started amlodipine and metoprolol in the last week.     Physical Exam   Triage Vital Signs: ED Triage Vitals  Encounter Vitals Group     BP 06/22/24 1245 (!) 141/81     Girls Systolic BP Percentile --      Girls Diastolic BP Percentile --      Boys Systolic BP Percentile --      Boys Diastolic BP Percentile --      Pulse Rate 06/22/24 1245 80     Resp 06/22/24 1245 16     Temp --      Temp src --      SpO2 06/22/24 1245 100 %     Weight 06/22/24 1246 84.8 kg (187 lb)     Height 06/22/24 1246 1.626 m (5' 4)     Head Circumference --      Peak Flow --      Pain Score 06/22/24 1246 6     Pain Loc --      Pain Education --      Exclude from Growth Chart --     Most recent vital signs: Vitals:   06/22/24 1245 06/22/24 1657  BP: (!) 141/81 (!) 167/80  Pulse: 80 (!) 56  Resp: 16 16  Temp:  98.3 F (36.8 C)  SpO2: 100% 100%     General: Awake, no distress.  CV:  Good peripheral perfusion.  Resp:  Normal effort.  Clear to auscultation bilaterally regular rate and rhythm Abd:  No distention.  Other:  Pulses equal in the upper extremities   ED Results / Procedures / Treatments   Labs (all labs ordered are listed, but only abnormal results are displayed) Labs Reviewed  BASIC METABOLIC PANEL WITH GFR - Abnormal; Notable for the following components:      Result Value   Sodium 134 (*)    Chloride 97 (*)    Glucose, Bld 100 (*)    All  other components within normal limits  CBC - Abnormal; Notable for the following components:   Platelets 472 (*)    All other components within normal limits  POC URINE PREG, ED  TROPONIN I (HIGH SENSITIVITY)  TROPONIN I (HIGH SENSITIVITY)     EKG  ED ECG REPORT I, Lamar Price, the attending physician, personally viewed and interpreted this ECG.  Date: 06/22/2024  Rhythm: normal sinus rhythm QRS Axis: normal Intervals: normal ST/T Wave abnormalities: normal Narrative Interpretation: Frequent PVCs    RADIOLOGY Chest x-ray reviewed interpreted by me, no acute abnormality    PROCEDURES:  Critical Care performed:   Procedures   MEDICATIONS ORDERED IN ED: Medications  iohexol  (OMNIPAQUE ) 350 MG/ML injection 75 mL (75 mLs Intravenous Contrast Given 06/22/24 1534)     IMPRESSION / MDM / ASSESSMENT AND PLAN / ED COURSE  I reviewed the triage vital signs and the nursing notes. Patient's presentation is most consistent with  acute presentation with potential threat to life or bodily function.  Patient presents with chest pain as detailed above, EKG demonstrates frequent PVCs which is known, however otherwise no signs of ST elevation.  Initial high sensitive troponin is normal  Given radiation of pain to the back will obtain CTA imaging, second troponin pending   CTAs reassuring, second troponin is normal, patient is feeling much better, asymptomatic, appropriate for discharge with follow-up with her cardiologist, strict return cautions, she agrees to this plan      FINAL CLINICAL IMPRESSION(S) / ED DIAGNOSES   Final diagnoses:  Nonspecific chest pain     Rx / DC Orders   ED Discharge Orders     None        Note:  This document was prepared using Dragon voice recognition software and may include unintentional dictation errors.   Arlander Charleston, MD 06/22/24 RUMALDO    Arlander Charleston, MD 06/22/24 [phone]

## 2024-06-22 NOTE — ED Triage Notes (Signed)
 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

## 2024-07-26 NOTE — Progress Notes (Signed)
  [facility] Pre-Procedure H&P  Primary Electrophysiologist  Selinda Sermon, M.D., Ph.D.  Primary Care Provider Rudolpho Norleen Lenis, MD  I have reviewed the medical and surgical history, family history, review of systems, date of last menses (if applicable), current medications, allergies and sensitivities, physical examination, laboratory and diagnostic data reviewed and recorded on the H&P form.    History and additional pertinent or relevant data: Alison Frazier 1 - PVC, [place] 43 y.o. female w/ HTN, freq PVCs, seen by RL 06/22/24 while in bigeminy, ref to Alison Frazier for ablation TTE: 04/25/24 EF 55%, RVSP 25 ECG: 06/29/24 SR 67, PR 162, QRS 88, Qtc 456 PVCs: LBBB, abrupt V3 transition, inferior axis, neg aVL, pos I, likely R/LCC Med: metop 25 BID [place], [place]  EKG rhythm: sinus rhythm  BP (!) 159/84   Pulse 74   Temp 36.8 C (98.2 F) (Oral)   Resp 16   Ht 162.6 cm (5' 4.02)   Wt 83.5 kg (184 lb 1.4 oz)   SpO2 98%   BMI 31.58 kg/m   Hematology Lab Results  Component Value Date   WBC 4.2 07/26/2024   HGB 12.6 07/26/2024   HCT 38.1 07/26/2024   MCV 80 07/26/2024   PLT 407 07/26/2024    Chemistries Lab Results  Component Value Date   CREATININE 0.9 09/21/2023   BUN 12 09/21/2023   NA 137 09/21/2023   K 4.0 09/21/2023   CL 105 09/21/2023   CO2 25.2 09/21/2023    INR No results found for: INR  General Appearance:  Alert, cooperative, no distress, appears stated age  Lungs:   Clear to auscultation bilaterally, respirations unlabored  Heart:  Irregular rate and rhythm  Extremities: Right Femoral site: clean, dry and intact, without hematoma, and without bruit Left Femoral site: clean, dry and intact, without hematoma, and without bruit Right DP: present 1+, PT: present 1+, edema none Left  DP: present 1+, PT: present 1+, edema none  Skin: No lower extremity rashes or ulcers   Plan for procedure: Cardiac Electrophysiology Ablation Alternative options: no  Cardiac Electrophysiology Ablation  The procedures are indicated to evaluate and/or treat (diagnosis): PVCs  Plan for General anesthesia: per anesthesia  ASA Classification: II - Mild disease  Plan for blood product transfusion: transfusion not anticipated  Additional diagnostic data needed: none  Current anticoagulation: none  Plan for post anticoagulation: none  Plan for post antiarrhythmic: N/A   Discharge planning: CVSSU observation/same day discharge  Additional physical, mental, neurologic status considerations and needs: none  Risks of the procedure were discussed with patient  today, including but not limited to vascular access injury, bleeding, hematoma, pulmonary vein stenosis, phrenic nerve paralysis, stroke, myocardial infarction, cardiac perforation, tamponade, esophageal injury and death. Risks including, but not limited to, death, bleeding, esophageal perforation, infection, aspiration, laryngospasm, bronchospasm, damage to oropharynx, damage to teeth, respiratory depression, hypotension myocardial infarction, arrhythmia, and skin burns were discussed with the patient who voiced understanding. All of the patient's questions were answered and informed consents were signed and placed into the patient's record.  A pre-procedure cardiac TTE was done to evaluate the morphology and function of the left ventricle, left atrium, right atrium, right ventricle, and scarring within the cardiac chamber.  Serene GRADE. Azucena, M.D., Ph.D. Clinical Cardiac Electrophysiology Fellow, PGY-8 Pager [phone]

## 2024-07-26 NOTE — Progress Notes (Signed)
 Post PVC ablation instructions provided to and reviewed with the patient. Provided patient with number, [facility] number, and after hours number to call if she has any further questions or concerns. All questions answered.   Patient wishes to follow up at [facility].    EZZIE LYONS, RN, CSN Electrophysiology Nurse

## 2024-07-26 NOTE — Procedures (Signed)
  [facility] Procedure Note   Procedure - Electrophysiology Study and PVC Ablation  DEARA BOBER 07/26/2024   Attending: Selinda Sermon, MD, PhD  Assistant(s): Serene Makos, M.D., Ph.D.   PVC / Substrate type: idiopathic    Presenting Rhythm: Normal sinus rhythm with PVCs  Complications: none  Anesthesia: monitored anesthesia care  Estimated Blood Loss: minimal  Procedures done: Intracardiac Catheter Ablation of Ventricular Tachycardia Stimulation and pacing after drug infusion Intracardiac echocardiography  Access: - RFV: 8.5 Fr SL1 exchanged for large curl Vizigo - RFV: 11 Fr 35 cm BriteTip - RFA: 5Fr short, exchanged for 8.5 Fr Ventrax MS  Catheters: - 10Fr NuVision ICE catheter - D/F 36 pole Optrell Mapping Catheter - D/F SmartTouch SF Ablation Catheter - F Curve DecaNav  Comments: - Uncomplicated access with ultrasound guidance and micropuncture needles - Frequent PVCs on arrival with LBBB morphology, V2 transition, inferior axis, biphasic in lead I, negative in lead aVL - V2 transition ratio consistent with RVOT origin; MDI of 0.66 c/w epicardial/mid-myocardial component - Heparin administered to target ACT 350 + 25 seconds - Three dimensional anatomy of right ventricle, right ventricular outflow tract and pulmonary artery identified with CARTOSOUND and used to guide fast anatomic map with CARTO 3 - Comprehensive fast anatomic map of the right ventricle and outflow tract, coronary sinus/great cardiac vein/proximal anterior interventricular vein, and left ventricle and outflow tract created with the Optrell catheter characterizing underlying substrate and local activation time of the clinical premature ventricular contractions  - Earliest activation localized to anterior RVOT, with later onset in distal AIV and LVOT - Local electrogram 18 ms pre-QRS for PVCs at earliest site in RVOT - Pacemapping at this site with 94 % match to clinical PVCs -  Ablation in anterior RVOT resulted in bursts of PVCs matching clinical PVCs, however, with continued PVCs despite ablation - Ablation is distal AIV adjacent to the RVOT lesions resulted suppression of PVCs with return of PVCs after ablation - Persistence of clinical PVCs following ablation, likely suggestive of mid-myocardial origin  Summary: - Clinical PVCs mapped to mid-myocardial region in between anterior RVOT and AIV - s/p partially successful RF ablation of clinical PVCs, with residual PVCs   Plan: - Routine post-procedure care with bedrest for 4 hours - Discharge Plan: today after post-procedure bedrest - Stop amlodipine - Start verapamil 180mg  XR - Switch from metoprolol tartrate 25mg  BID to succinate 50mg  daily - Follow-up in clinic EP w/ Dr. Sermon Serene GRADE. Makos, M.D., Ph.D. Clinical Cardiac Electrophysiology Fellow, PGY-8 Pager [phone]

## 2024-07-26 NOTE — Discharge Summary (Signed)
 [facility]                      Electrophysiology Discharge Summary   Admit Date: 07/26/2024  Discharge Date: 07/27/2024  Admitting Physician: Selinda Chauncey Sermon, MD  Discharge Physician: MELCHOR LYNWOOD SQUIBB   Primary Care Provider: Rudolpho Norleen Lenis, MD  Primary Electrophysiologist: Sermon Selinda Chauncey, MD   Discharge Destination: Home  Discharge Services: none  Code Status: No Order   Admission Diagnoses:  Palpitation [R00.2] PVC (premature ventricular contraction) [I49.3]  Discharge Diagnoses:  Principal Problem:   Symptomatic PVCs Active Problems:   S/P ablation of ventricular arrhythmia Resolved Problems:   * No resolved hospital problems. *     Anticipatory Guidance (key med changes, results pending, future labs, IV therapies):    - s/p partially successful RF ablation of clinical PVCs, with residual PVCs  - Stop amlodipine - Start verapamil 180mg  XR - Switch from metoprolol tartrate 25mg  BID to succinate 50mg  daily - Start Colchine x 1 week for chest pain post procedure. POCUS without without effusion    Cardiac Rehab: No clinical indication for this encounter  Patient Discharge Instructions:   If you smoke (or have smoked within the last year), we strongly recommend that you do not smoke.   Weigh yourself daily and record   Low cholesterol, low fat   Notify cardiology provider of chest pain   Notify provider of swelling in arms, legs, or stomach   Notify provider temperature greater than 101.0 F (38.3 C) degrees   Notify provider of weight gain greater than 2 lbs in 1 day or 5 lbs in 1 week   Report questions or concerns to the [facility] Center at [phone]   Notify primary care physician of other symptoms   For a life-threatening emergency, call 911   Follow-up with Primary Care Provider   Follow-up with Cardiology   Notify provider of nausea or vomiting   Notify Provider of bleeding or expanding  swelling at groin catheter access site   Notify provider of dizziness or passing out   Notify provider of difficulty breathing or shortness of breath   Light activity    [facility] Provider Follow-up: Future Appointments  Date Time Provider Department Center  08/18/2024  3:30 PM Turner, Therisa Norris, NP [facility]    Non-[facility] Provider Follow-up: none  Report Issues: By using [facility], or by calling the [facility] at [phone].  For urgent issues or after business hours (after 5pm on weekdays and anytime on weekends), call the [facility] Operator [phone] and ask to page the on-call cardiologist.    Allergies/Intolerances:  Allergies  Allergen Reactions  . Shellfish Containing Products Anaphylaxis     Medications:     Discharge Medications     New Medications      Details  colchicine 0.6 mg tablet Commonly known as: COLCRYS  0.6 mg, Oral, 2 times Daily Quantity: 14 tablet Refills: 0   metoprolol SUCCinate 50 MG XL tablet Commonly known as: TOPROL-XL  50 mg, Oral, Daily Quantity: 90 tablet Refills: 3   verapamiL 180 MG SR tablet Commonly known as: CALAN-SR  180 mg, Oral, Daily Quantity: 90 tablet Refills: 3       Medications To Continue      Details  * gabapentin  100 MG  capsule Commonly known as: NEURONTIN   100 mg, Oral, Nightly Quantity: 30 capsule Refills: 1   * gabapentin  300 MG capsule Commonly known as: NEURONTIN   TAKE 1 CAPSULE(300 MG TOTAL) BY MOUTH EVERY NIGHT AT BEDTIME FOR 4 DAYS, THEN 1 CAPSULE TWICE DAILY FOR 4 DAYS, THEN 1 CAPSULE THREE TIMES DAILY Quantity: 90 capsule Refills: 5   inhalational spacer spacer Commonly known as: AEROCHAMBER  Please instruct patient on use Quantity: 1 each Refills: 2   losartan-hydroCHLOROthiazide 100-12.5 mg tablet Commonly known as: HYZAAR  1 tablet, Oral, Daily Quantity: 30 tablet Refills: 11   montelukast 10 mg tablet Commonly known as: SINGULAIR  10  mg, Oral, Nightly Quantity: 90 tablet Refills: 1      * * There are duplicate medications prescribed to the patient          Stopped Medications    amLODIPine 5 MG tablet Commonly known as: NORVASC   metoprolol TARTrate 25 MG tablet Commonly known as: LOPRESSOR       Unreviewed Medications      Details  albuterol MDI (PROVENTIL, VENTOLIN, PROAIR) HFA 90 mcg/actuation inhaler  INHALE 2 INHALATIONS INTO THE LUNGS EVERY 6 HOURS AS NEEDED FOR WHEEZING Quantity: 18 g Refills: 3   diazePAM 5 MG tablet Commonly known as: VALIUM  1 po 30 minutes before ESI Quantity: 1 tablet Refills: 0          Anticoagulation: Not indicated INR goal: Not applicable   Brief History of Present Illness: Per the H&P dated on 07/26/2024:  43 y.o. female w/ HTN, freq PVCs, seen by RL 06/22/24 while in bigeminy, ref to [facility] for ablation  __________  Hospital Course by Problem:    On 07/26/2024, Dr. Selinda Sermon proceeded with implantation of PVC's.  Partially successful RF ablation of clinical PVCs, with residual PVCs, plan to stop amlodipine, start verapamil 180 mg XR and transition from metoprolol tartrate 25 BID to succinate 50 mg daily.  Please see operative note for details. Patient tolerated the procedure well and had no immediate post-procedure complications. 24 hour telemetry showed bigeminy. At time of discharge, patient appeared euvolemic without complaints of chest pain, SOB, N/V, F/C, urinary retention, and no evidence of groin bleed. Discharged to home in stable condition.   Comorbid Conditions: Electrolyte Disorders:    Hypokalemia:   Potassium supplement administered.  Will continue to monitor.             Imaging and Procedures Performed:  07/26/24  Procedure - Electrophysiology Study and PVC Ablation  Summary: - Clinical PVCs mapped to mid-myocardial region in between anterior RVOT and AIV - s/p partially successful RF ablation of clinical PVCs, with residual PVCs   Plan: - Routine post-procedure care with bedrest for 4 hours - Discharge Plan: today after post-procedure bedrest - Stop amlodipine - Start verapamil 180mg  XR - Switch from metoprolol tartrate 25mg  BID to succinate 50mg  daily - Follow-up in clinic EP w/ Dr. Sermon  Discharge Exam:  Admission Weight: 83.5 kg (184 lb 1.4 oz)  Discharge Weight: Weight: 85 kg (187 lb 6.3 oz) BMI: Body mass index is 32.15 kg/m. BP 121/70 Comment: standing  Pulse 72   Temp 36.9 C (98.5 F) (Oral)   Resp 14   Ht 162.6 cm (5' 4.02)   Wt 85 kg (187 lb 6.3 oz)   SpO2 94%   BMI 32.15 kg/m   General: alert, cooperative, and in NAD Respiratory: regular rate, symmetric, unlabored, clear to auscultation bilaterally, and no accessory muscle  use Cardiac: regular rate, regular rhythm, S1, S2 present, no murmur, no rub, no gallop, and JVD at low neck at 35 degrees Abdomen: normal bowel sounds, soft, nontender, and nondistended Extremities: extremities warm and well perfused, no clubbing or cyanosis, no edema, and distal pulses intact R groin sites stable with good pulses, no hematoma or bruit Lines: none  Pertinent Lab Testing:  BMP: Recent Labs  Lab 07/27/24 0528  NA 136  K 3.9  CL 99  CO2 22  BUN 8  CREATININE 0.8  GLUCOSE 104  CALCIUM 8.8  MG 2.2   CBC: Recent Labs  Lab 07/27/24 0528  WBC 6.7  HGB 11.9  HCT 35.8  PLT 389   INR: Recent Labs  Lab 07/26/24 0644  INR 1.0    TFTs: No results for input(s): TSH, T4FREE in the last 168 hours.       Other Pertinent Labs:  None  _____________________  Time spent on discharge process: >30 minutes  ALLISON LINDGREN, PA    Attestation Statement:   I personally saw the patient and performed a substantive portion of the medical decision making, in conjunction with the Advanced Practice Provider for the condition/treatment of  -deep intramural/septal PVC suppressed transiently with ablation in coronary vein  -plan for medical  therapy -had mskltl and/or pericardial pain w/o effusion or WMA last nt on bedside echo reviewed personally -pain now essentially resolved -slightly dizzy but orthostatics only minimally abnormal -ready for dc  Lynwood Belvie Pump, MD  *Some images could not be shown.

## 2024-07-27 NOTE — Progress Notes (Signed)
 Patient meets discharge criteria:  Vital signs returned to baseline  O2 saturations are stable and at baseline  RASS score -1 to +1  Absent of nausea and vomiting.  Skin warm and dry.  Pain score <5 on 0-10 scale or at baseline  Protective reflexes are present  Absent of bladder distention.  Tube, catheters drains patent  Oral temperature > 35.5C  Dressing dry; or marked if drainage is present.   PIV removed with tip intact. Discussed results of procedure with physician and verbalized understanding. Telemetry discontinued.   Patient discharge instructions given to patient and family member. Reviewed sedation, S&S to report to provider, follow-up appointments, prescription medications,  diet, and activity, with patient and family member/friend.   Patient discharged with printed AVS instructions. Patient verbalized understanding and ability to comply. The personal belongings were returned to the patient. Patient and family member/friend state that they have no further questions at this time and will contact the numbers provided if any issues arise.  Patient states they feel comfortable leaving.     Pt transported for discharge by transporter/family/friend in wheelchair and sent home to be cared by self and family/friend.  Discharge time: 07/27/2024 Discharge destination: Home  Signed: LAUREN  GUY, RN                 07/27/2024

## 2024-07-27 NOTE — Care Plan (Signed)
  Problem: Dysrhythmia: Goal: Patient will maintain a hemodynamically stable rhythm Outcome: Progressing   Problem: Alteration in Hemodynamic Stability: Goal: Ability to maintain absence of bleeding will  improve. Outcome: Progressing   Problem: Altered ability to manage pain: Goal: Pain level will decrease Outcome: Progressing

## 2024-08-03 ENCOUNTER — Other Ambulatory Visit: Payer: Self-pay | Admitting: Nurse Practitioner

## 2024-08-03 ENCOUNTER — Encounter: Payer: Self-pay | Admitting: Nurse Practitioner

## 2024-08-03 ENCOUNTER — Other Ambulatory Visit: Payer: Self-pay

## 2024-08-03 ENCOUNTER — Ambulatory Visit
Admission: RE | Admit: 2024-08-03 | Discharge: 2024-08-03 | Disposition: A | Discharge status: Home / Self Care | Source: Ambulatory Visit | Attending: Nurse Practitioner | Admitting: Nurse Practitioner

## 2024-08-03 DIAGNOSIS — I729 Aneurysm of unspecified site: Secondary | ICD-10-CM | POA: Insufficient documentation

## 2024-08-22 ENCOUNTER — Other Ambulatory Visit: Payer: Self-pay | Admitting: Internal Medicine

## 2024-08-22 DIAGNOSIS — I10 Essential (primary) hypertension: Secondary | ICD-10-CM

## 2024-08-22 DIAGNOSIS — Z8679 Personal history of other diseases of the circulatory system: Secondary | ICD-10-CM

## 2024-08-22 DIAGNOSIS — I493 Ventricular premature depolarization: Secondary | ICD-10-CM

## 2024-09-25 ENCOUNTER — Encounter (HOSPITAL_COMMUNITY): Payer: Self-pay

## 2024-09-27 ENCOUNTER — Other Ambulatory Visit: Payer: Self-pay | Admitting: Internal Medicine

## 2024-09-27 ENCOUNTER — Ambulatory Visit
Admission: RE | Admit: 2024-09-27 | Discharge: 2024-09-27 | Disposition: A | Discharge status: Home / Self Care | Source: Ambulatory Visit | Attending: Internal Medicine | Admitting: Internal Medicine

## 2024-09-27 DIAGNOSIS — Z8679 Personal history of other diseases of the circulatory system: Secondary | ICD-10-CM | POA: Diagnosis present

## 2024-09-27 DIAGNOSIS — I10 Essential (primary) hypertension: Secondary | ICD-10-CM

## 2024-09-27 DIAGNOSIS — I493 Ventricular premature depolarization: Secondary | ICD-10-CM | POA: Insufficient documentation

## 2024-09-27 DIAGNOSIS — Z9889 Other specified postprocedural states: Secondary | ICD-10-CM | POA: Insufficient documentation

## 2024-09-27 MED ORDER — GADOBUTROL 1 MMOL/ML IV SOLN
10.0000 mL | Freq: Once | INTRAVENOUS | Status: AC | PRN
  Administered 2024-09-27: 10 mL via INTRAVENOUS

## 2024-10-13 ENCOUNTER — Other Ambulatory Visit: Payer: Self-pay

## 2024-10-13 ENCOUNTER — Emergency Department

## 2024-10-13 ENCOUNTER — Emergency Department
Admission: EM | Admit: 2024-10-13 | Discharge: 2024-10-13 | Disposition: A | Discharge status: Home / Self Care | Attending: Emergency Medicine | Admitting: Emergency Medicine

## 2024-10-13 DIAGNOSIS — R002 Palpitations: Secondary | ICD-10-CM | POA: Insufficient documentation

## 2024-10-13 DIAGNOSIS — R2242 Localized swelling, mass and lump, left lower limb: Secondary | ICD-10-CM | POA: Insufficient documentation

## 2024-10-13 LAB — BASIC METABOLIC PANEL WITH GFR
Anion gap: 10 (ref 5–15)
BUN: 11 mg/dL (ref 6–20)
CO2: 25 mmol/L (ref 22–32)
Calcium: 9.7 mg/dL (ref 8.9–10.3)
Chloride: 101 mmol/L (ref 98–111)
Creatinine, Ser: 0.81 mg/dL (ref 0.44–1.00)
GFR, Estimated: >60 mL/min
Glucose, Bld: 91 mg/dL (ref 70–99)
Potassium: 4 mmol/L (ref 3.5–5.1)
Sodium: 136 mmol/L (ref 135–145)

## 2024-10-13 LAB — CBC
HCT: 38.3 % (ref 36.0–46.0)
Hemoglobin: 12.7 g/dL (ref 12.0–15.0)
MCH: 27.2 pg (ref 26.0–34.0)
MCHC: 33.2 g/dL (ref 30.0–36.0)
MCV: 82 fL (ref 80.0–100.0)
Platelets: 355 K/uL (ref 150–400)
RBC: 4.67 MIL/uL (ref 3.87–5.11)
RDW: 12.5 % (ref 11.5–15.5)
WBC: 8.5 K/uL (ref 4.0–10.5)
nRBC: 0 % (ref 0.0–0.2)

## 2024-10-13 LAB — TROPONIN T, HIGH SENSITIVITY
Troponin T High Sensitivity: <15 ng/L (ref 0–19)
Troponin T High Sensitivity: <15 ng/L (ref 0–19)

## 2024-10-13 MED ORDER — IOHEXOL 350 MG/ML SOLN
75.0000 mL | Freq: Once | INTRAVENOUS | Status: AC | PRN
  Administered 2024-10-13: 75 mL via INTRAVENOUS

## 2024-10-13 NOTE — ED Notes (Signed)
 Applied pt to the cardiac monitor, obtained vital signs, Pt blood pressure the first time was 181/102(123) on the right arm. RN Informed blood pressure is elevated. Took Pt blood pressure again and was 189/106(126). Switched blood pressure cuff to left arm and blood pressure is 187/87(117).

## 2024-10-13 NOTE — ED Notes (Signed)
 Se triage note Presents with friend  States she developed some palpitations with some chest discomfort  States the chest pain was mid chest

## 2024-10-13 NOTE — ED Provider Notes (Addendum)
 "  [facility] Provider Note    Event Date/Time   First MD Initiated Contact with Patient 10/13/24 1745     (approximate)   History   Chest Pain   HPI  Alison Frazier is a 44 y.o. female who comes in for palpitations and centralized chest pain that started around 11:30 AM.  Patient does report history of an ablation on 10/29 that was unsuccessful patient is currently on metoprolol for rate control.  I reviewed the discharge summary where patient is on metoprolol succinate 50 daily as well as verapamil 180 daily.  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  Patient reports intermittent chest palpitations and chest pain, nausea.  She reports that the pain that she had today was mild, radiated into her back.  She reports that previously she has had pain when she has had palpitations and it felt similarly radiating into her back.  She denies any new numbness, tingling or continued pain.  She reports a little bit of intermittent palpitations but otherwise feels at her baseline self.  I reviewed a note where patient was seen in the emergency room on 06/22/2024 with pain in her chest radiating to her back.  At that time she had done a CT angio which was negative.  And patient was discharged home.   Patient denies any shortness of breath, pain with breathing.  She does report a little bit of swelling in her left leg.  Pt did report headache in triage- to me she says it was mild- not the worst headache of life and similar to prior headaches and is not concerned about it  Physical Exam   Triage Vital Signs: ED Triage Vitals [10/13/24 1427]  Encounter Vitals Group     BP 127/82     Girls Systolic BP Percentile      Girls Diastolic BP Percentile      Boys Systolic BP Percentile      Boys Diastolic BP Percentile      Pulse Rate (!) 53     Resp 18     Temp 98 F (36.7 C)     Temp Source Oral     SpO2 98 %     Weight       Height      Head Circumference      Peak Flow      Pain Score 7     Pain Loc      Pain Education      Exclude from Growth Chart     Most recent vital signs: Vitals:   10/13/24 1427  BP: 127/82  Pulse: (!) 53  Resp: 18  Temp: 98 F (36.7 C)  SpO2: 98%     General: Awake, no distress.  CV:  Good peripheral perfusion.  No murmur Resp:  Normal effort.  Abd:  No distention.  Soft and nontender Other:  Equal radial pulses equal DP pulses.  Sensation intact throughout Some minimal swelling in the left leg  ED Results / Procedures / Treatments   Labs (all labs ordered are listed, but only abnormal results are displayed) Labs Reviewed  BASIC METABOLIC PANEL WITH GFR  CBC  POC URINE PREG, ED  TROPONIN T, HIGH SENSITIVITY  TROPONIN T, HIGH SENSITIVITY     EKG  My interpretation of EKG:  Sinus bradycardia without any ST elevations or T wave inversions, occasional PVC  RADIOLOGY I have reviewed the xray  personally and interpreted no evidence of any pneumonia   PROCEDURES:  Critical Care performed: No  .1-3 Lead EKG Interpretation  Performed by: Ernest Ronal BRAVO, MD Authorized by: Ernest Ronal BRAVO, MD     Interpretation: abnormal     ECG rate:  50   ECG rate assessment: bradycardic     Rhythm: sinus bradycardia     Ectopy: PVCs     Conduction: normal      MEDICATIONS ORDERED IN ED: Medications - No data to display   IMPRESSION / MDM / ASSESSMENT AND PLAN / ED COURSE  I reviewed the triage vital signs and the nursing notes.   Patient's presentation is most consistent with acute presentation with potential threat to life or bodily function.   Patient comes in with some chest pain, palpitations.  Initially was concerned about the possibility of dissection as she did report pain radiating into her back but she states that the pain has now since all relieved and she actually states that this is how she typically presents when she has the palpitations.  I did see  where she had a CT angio done last time in September that was negative.  She has got no evidence of dissection based upon examination.  Troponin was negative BMP reassuring CBC reassuring.  Ultrasound was negative.  Patient was handed off to oncoming team pending repeat troponin.  Patient on the cardiac monitor does have some occasional PVCs but otherwise rhythm strip is reassuring.  I would be hesitant to adjust her medications as she is already bradycardic to the 50s so I did recommend that she follow-up with her primary care doctor or her cardiologist to discuss this further.  Patient's blood pressures initially with the staff had normal blood pressures on both side but significantly hypertensive but when I took him there was a systolic difference of greater than 15 I did recheck this twice so I did order a CT angio as well just to ensure no evidence of dissection.  Patient unclear if she wants to do the CT angio.  She understands that there is a risk for death or permanent disability if dissection is missed but she is still thinking if she wants to undergo it.  Patient is a capacity make this decision she understands the risk. Pt denies any headache currently. She denies anything to suggest it being [facility] and reports it has resolved.    The patient is on the cardiac monitor to evaluate for evidence of arrhythmia and/or significant heart rate changes.  Clinical Course as of 10/13/24 2002  Fri Oct 13, 2024  1961 S/o from Dr. Ernest - sudden onset cp to back, palpitations - possible BP discepancy between arms, CTA dissection protocol pending  TO DO: - f/u rpt trop - f/u CTA if patient amenable  [MM]    Clinical Course User Index [MM] Clarine Ozell LABOR, MD     FINAL CLINICAL IMPRESSION(S) / ED DIAGNOSES   Final diagnoses:  Palpitations     Rx / DC Orders   ED Discharge Orders     None        Note:  This document was prepared using Dragon voice recognition software and may  include unintentional dictation errors.   Ernest Ronal BRAVO, MD 10/13/24 KENITH    Ernest Ronal BRAVO, MD 10/13/24 2000    Ernest Ronal BRAVO, MD 10/13/24 2003  "

## 2024-10-13 NOTE — Discharge Instructions (Signed)
 Please call your cardiologist to discuss this and return to the ER if you develop worsening symptoms or any other concerns

## 2024-10-13 NOTE — ED Notes (Signed)
 Pt provided discharge instructions and prescription information. Pt was given the opportunity to ask questions and questions were answered.

## 2024-10-13 NOTE — ED Notes (Signed)
 Called CCMD to place pt on monitor

## 2024-10-13 NOTE — ED Triage Notes (Signed)
 Pt to ED via [facility] from work. 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 Some nausea and HA. Pt had ablation on 10/29 that was unsuccessful. Pt on metoprolol for rate control.   324mg  ASA 124/70 100% RA SB HR 55 with PVCs
